# Patient Record
Sex: Female | Born: 1975 | Race: White | Hispanic: No | Marital: Single | Smoking: Current every day smoker
Health system: Southern US, Community
[De-identification: ages and names within clinical notes are randomized; demographics above are authoritative.]

## PROBLEM LIST (undated history)

## (undated) DIAGNOSIS — F418 Other specified anxiety disorders: Secondary | ICD-10-CM

## (undated) DIAGNOSIS — Z9889 Other specified postprocedural states: Secondary | ICD-10-CM

## (undated) DIAGNOSIS — G43909 Migraine, unspecified, not intractable, without status migrainosus: Secondary | ICD-10-CM

## (undated) DIAGNOSIS — R110 Nausea: Secondary | ICD-10-CM

## (undated) DIAGNOSIS — R12 Heartburn: Secondary | ICD-10-CM

## (undated) DIAGNOSIS — J189 Pneumonia, unspecified organism: Secondary | ICD-10-CM

## (undated) DIAGNOSIS — F329 Major depressive disorder, single episode, unspecified: Secondary | ICD-10-CM

## (undated) DIAGNOSIS — F32A Depression, unspecified: Secondary | ICD-10-CM

## (undated) DIAGNOSIS — L502 Urticaria due to cold and heat: Secondary | ICD-10-CM

## (undated) DIAGNOSIS — R519 Headache, unspecified: Secondary | ICD-10-CM

## (undated) DIAGNOSIS — J410 Simple chronic bronchitis: Secondary | ICD-10-CM

## (undated) DIAGNOSIS — F419 Anxiety disorder, unspecified: Secondary | ICD-10-CM

## (undated) DIAGNOSIS — K219 Gastro-esophageal reflux disease without esophagitis: Secondary | ICD-10-CM

## (undated) DIAGNOSIS — M1990 Unspecified osteoarthritis, unspecified site: Secondary | ICD-10-CM

## (undated) DIAGNOSIS — D071 Carcinoma in situ of vulva: Secondary | ICD-10-CM

## (undated) DIAGNOSIS — R87610 Atypical squamous cells of undetermined significance on cytologic smear of cervix (ASC-US): Secondary | ICD-10-CM

## (undated) DIAGNOSIS — M26629 Arthralgia of temporomandibular joint, unspecified side: Secondary | ICD-10-CM

## (undated) DIAGNOSIS — C801 Malignant (primary) neoplasm, unspecified: Secondary | ICD-10-CM

## (undated) DIAGNOSIS — G8929 Other chronic pain: Secondary | ICD-10-CM

## (undated) HISTORY — DX: Anxiety disorder, unspecified: F41.9

## (undated) HISTORY — DX: Unspecified osteoarthritis, unspecified site: M19.90

## (undated) HISTORY — PX: WISDOM TOOTH EXTRACTION: SHX21

## (undated) HISTORY — PX: EYE SURGERY: SHX253

## (undated) HISTORY — DX: Other specified anxiety disorders: F41.8

## (undated) HISTORY — DX: Atypical squamous cells of undetermined significance on cytologic smear of cervix (ASC-US): R87.610

## (undated) HISTORY — PX: LEEP: SHX91

## (undated) HISTORY — DX: Depression, unspecified: F32.A

## (undated) HISTORY — DX: Migraine, unspecified, not intractable, without status migrainosus: G43.909

## (undated) HISTORY — DX: Major depressive disorder, single episode, unspecified: F32.9

## (undated) HISTORY — DX: Nausea: R11.0

## (undated) HISTORY — DX: Heartburn: R12

---

## 1998-04-10 ENCOUNTER — Other Ambulatory Visit: Admission: RE | Admit: 1998-04-10 | Discharge: 1998-04-10 | Payer: Self-pay | Admitting: Obstetrics and Gynecology

## 1998-11-22 ENCOUNTER — Inpatient Hospital Stay (HOSPITAL_COMMUNITY): Admission: AD | Admit: 1998-11-22 | Discharge: 1998-11-24 | Payer: Self-pay

## 1999-08-02 ENCOUNTER — Emergency Department (HOSPITAL_COMMUNITY): Admission: EM | Admit: 1999-08-02 | Discharge: 1999-08-02 | Payer: Self-pay | Admitting: Emergency Medicine

## 1999-11-28 ENCOUNTER — Encounter: Admission: RE | Admit: 1999-11-28 | Discharge: 1999-11-28 | Payer: Self-pay | Admitting: Obstetrics & Gynecology

## 1999-11-28 ENCOUNTER — Other Ambulatory Visit: Admission: RE | Admit: 1999-11-28 | Discharge: 1999-11-28 | Payer: Self-pay | Admitting: *Deleted

## 2000-10-11 ENCOUNTER — Emergency Department (HOSPITAL_COMMUNITY): Admission: EM | Admit: 2000-10-11 | Discharge: 2000-10-11 | Payer: Self-pay | Admitting: *Deleted

## 2000-10-19 ENCOUNTER — Other Ambulatory Visit: Admission: RE | Admit: 2000-10-19 | Discharge: 2000-10-19 | Payer: Self-pay | Admitting: Obstetrics & Gynecology

## 2000-10-19 ENCOUNTER — Encounter: Admission: RE | Admit: 2000-10-19 | Discharge: 2000-10-19 | Payer: Self-pay | Admitting: Obstetrics & Gynecology

## 2000-10-21 ENCOUNTER — Encounter: Admission: RE | Admit: 2000-10-21 | Discharge: 2000-10-21 | Payer: Self-pay

## 2000-11-25 ENCOUNTER — Encounter: Admission: RE | Admit: 2000-11-25 | Discharge: 2000-11-25 | Payer: Self-pay | Admitting: Hematology and Oncology

## 2000-12-23 ENCOUNTER — Encounter: Admission: RE | Admit: 2000-12-23 | Discharge: 2000-12-23 | Payer: Self-pay

## 2000-12-29 ENCOUNTER — Other Ambulatory Visit: Admission: RE | Admit: 2000-12-29 | Discharge: 2000-12-29 | Payer: Self-pay | Admitting: Obstetrics & Gynecology

## 2000-12-29 ENCOUNTER — Emergency Department (HOSPITAL_COMMUNITY): Admission: EM | Admit: 2000-12-29 | Discharge: 2000-12-29 | Payer: Self-pay | Admitting: Emergency Medicine

## 2000-12-31 ENCOUNTER — Emergency Department (HOSPITAL_COMMUNITY): Admission: EM | Admit: 2000-12-31 | Discharge: 2000-12-31 | Payer: Self-pay | Admitting: Emergency Medicine

## 2001-01-04 ENCOUNTER — Encounter: Admission: RE | Admit: 2001-01-04 | Discharge: 2001-01-04 | Payer: Self-pay | Admitting: Obstetrics & Gynecology

## 2001-01-25 ENCOUNTER — Encounter: Admission: RE | Admit: 2001-01-25 | Discharge: 2001-01-25 | Payer: Self-pay | Admitting: Obstetrics & Gynecology

## 2001-11-22 ENCOUNTER — Emergency Department (HOSPITAL_COMMUNITY): Admission: EM | Admit: 2001-11-22 | Discharge: 2001-11-22 | Payer: Self-pay | Admitting: Emergency Medicine

## 2001-12-19 ENCOUNTER — Encounter: Admission: RE | Admit: 2001-12-19 | Discharge: 2001-12-19 | Payer: Self-pay | Admitting: Internal Medicine

## 2002-01-02 ENCOUNTER — Emergency Department (HOSPITAL_COMMUNITY): Admission: EM | Admit: 2002-01-02 | Discharge: 2002-01-02 | Payer: Self-pay | Admitting: Emergency Medicine

## 2002-03-09 ENCOUNTER — Emergency Department (HOSPITAL_COMMUNITY): Admission: EM | Admit: 2002-03-09 | Discharge: 2002-03-09 | Payer: Self-pay | Admitting: Emergency Medicine

## 2002-12-25 ENCOUNTER — Other Ambulatory Visit: Admission: RE | Admit: 2002-12-25 | Discharge: 2002-12-25 | Payer: Self-pay | Admitting: Obstetrics and Gynecology

## 2003-04-23 ENCOUNTER — Other Ambulatory Visit: Admission: RE | Admit: 2003-04-23 | Discharge: 2003-04-23 | Payer: Self-pay | Admitting: Obstetrics and Gynecology

## 2003-07-23 ENCOUNTER — Other Ambulatory Visit: Admission: RE | Admit: 2003-07-23 | Discharge: 2003-07-23 | Payer: Self-pay | Admitting: Obstetrics and Gynecology

## 2003-09-08 HISTORY — PX: TEMPOROMANDIBULAR JOINT SURGERY: SHX35

## 2003-10-29 ENCOUNTER — Other Ambulatory Visit: Admission: RE | Admit: 2003-10-29 | Discharge: 2003-10-29 | Payer: Self-pay | Admitting: Obstetrics and Gynecology

## 2004-07-11 ENCOUNTER — Emergency Department (HOSPITAL_COMMUNITY): Admission: EM | Admit: 2004-07-11 | Discharge: 2004-07-11 | Payer: Self-pay | Admitting: Emergency Medicine

## 2004-11-27 ENCOUNTER — Emergency Department (HOSPITAL_COMMUNITY): Admission: EM | Admit: 2004-11-27 | Discharge: 2004-11-27 | Payer: Self-pay | Admitting: Family Medicine

## 2004-12-26 ENCOUNTER — Emergency Department (HOSPITAL_COMMUNITY): Admission: EM | Admit: 2004-12-26 | Discharge: 2004-12-26 | Payer: Self-pay | Admitting: Family Medicine

## 2006-05-16 IMAGING — CR DG FINGER MIDDLE 2+V*L*
1 series · 1 of 1 positions shown · non-contrast
Comparison: none

CLINICAL DATA: Slammed finger in car door.
 LEFT THIRD FINGER ? 3 VIEWS:
 There is a fracture of the tuft of the distal phalanx without significant displacement.  There also appears to be a soft tissue defect in the nail bed.

[view not recorded]
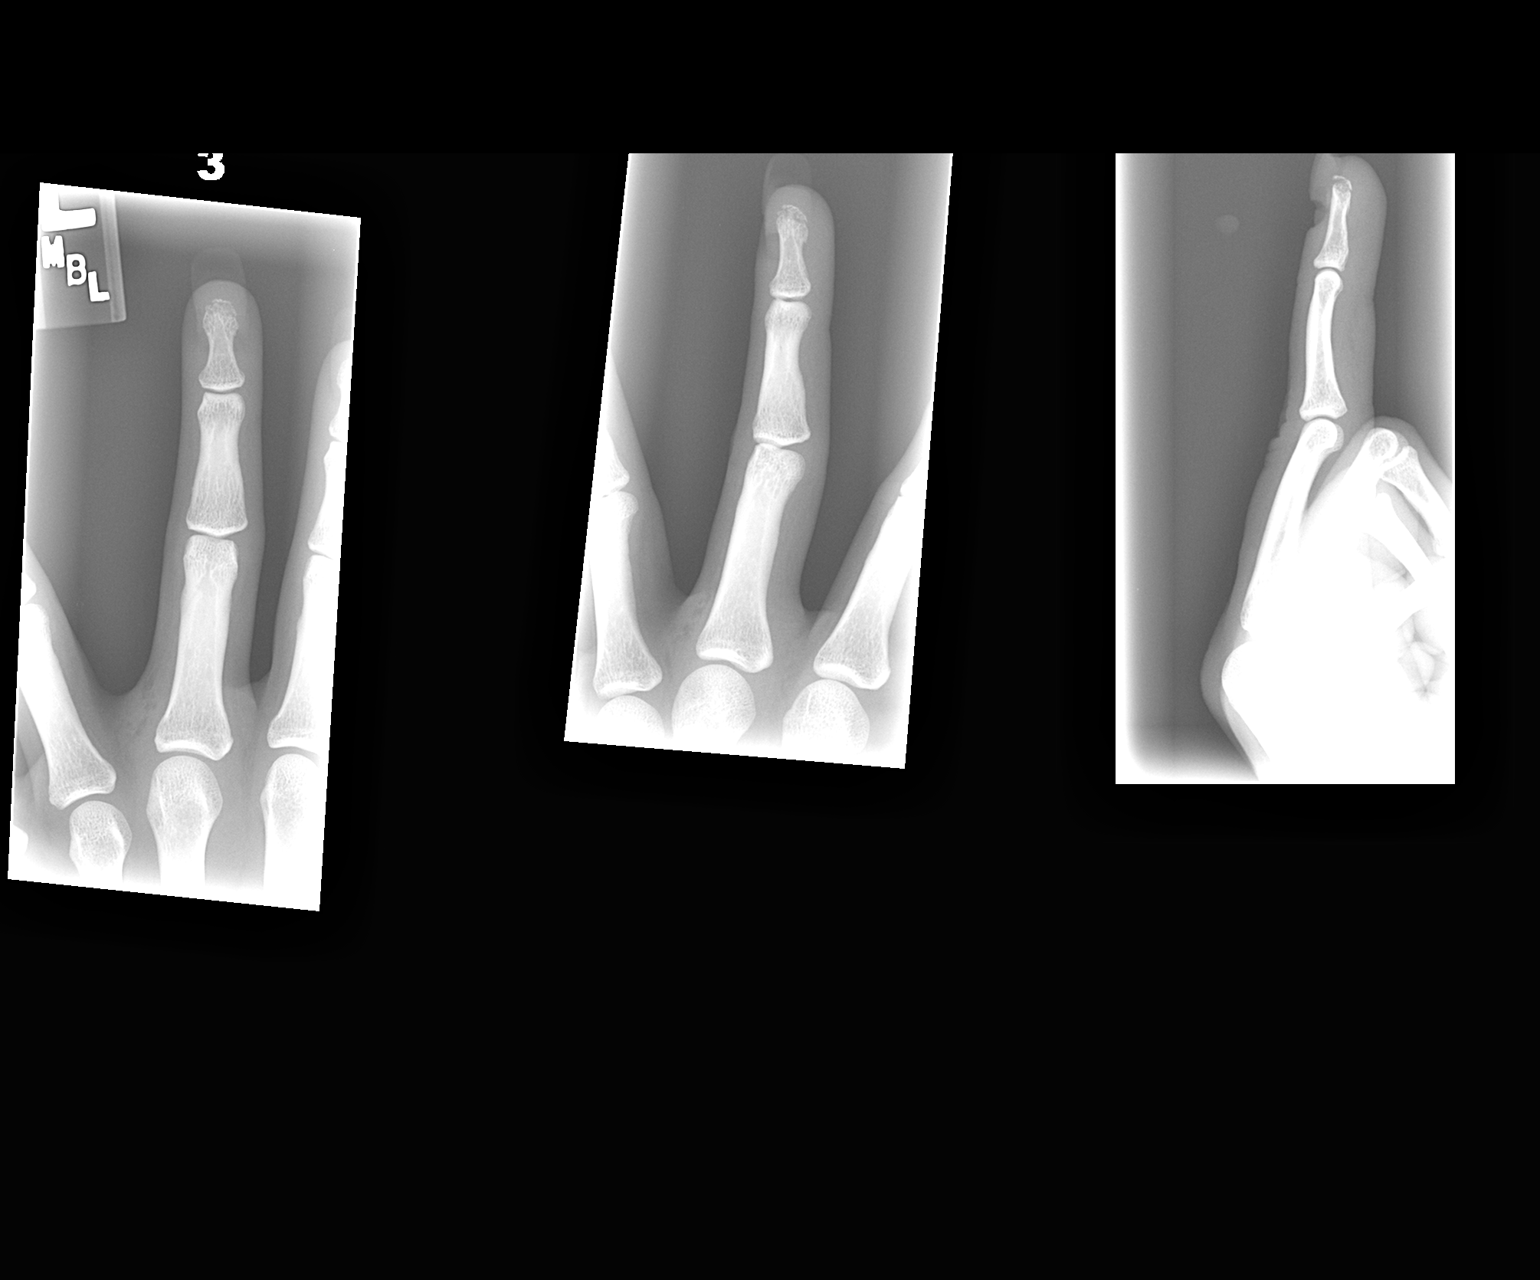

[1 of 1 positions shown; findings below may reference images not displayed]

IMPRESSION: Fracture of the tuft of the distal third phalanx.

## 2006-08-21 ENCOUNTER — Emergency Department (HOSPITAL_COMMUNITY): Admission: EM | Admit: 2006-08-21 | Discharge: 2006-08-21 | Payer: Self-pay | Admitting: Emergency Medicine

## 2008-07-20 ENCOUNTER — Emergency Department (HOSPITAL_COMMUNITY): Admission: EM | Admit: 2008-07-20 | Discharge: 2008-07-20 | Payer: Self-pay | Admitting: Emergency Medicine

## 2009-09-17 ENCOUNTER — Emergency Department (HOSPITAL_COMMUNITY): Admission: EM | Admit: 2009-09-17 | Discharge: 2009-09-17 | Payer: Self-pay | Admitting: Emergency Medicine

## 2010-01-02 ENCOUNTER — Emergency Department (HOSPITAL_COMMUNITY): Admission: EM | Admit: 2010-01-02 | Discharge: 2010-01-02 | Payer: Self-pay | Admitting: Family Medicine

## 2010-11-23 LAB — POCT RAPID STREP A (OFFICE): Streptococcus, Group A Screen (Direct): NEGATIVE

## 2010-11-25 LAB — WOUND CULTURE

## 2013-01-10 ENCOUNTER — Ambulatory Visit (INDEPENDENT_AMBULATORY_CARE_PROVIDER_SITE_OTHER): Payer: 59 | Admitting: Internal Medicine

## 2013-01-10 VITALS — BP 115/68 | HR 94 | Temp 98.1°F | Resp 16 | Ht 64.0 in | Wt 136.0 lb

## 2013-01-10 DIAGNOSIS — S161XXA Strain of muscle, fascia and tendon at neck level, initial encounter: Secondary | ICD-10-CM

## 2013-01-10 DIAGNOSIS — J039 Acute tonsillitis, unspecified: Secondary | ICD-10-CM

## 2013-01-10 DIAGNOSIS — J029 Acute pharyngitis, unspecified: Secondary | ICD-10-CM

## 2013-01-10 DIAGNOSIS — R509 Fever, unspecified: Secondary | ICD-10-CM

## 2013-01-10 DIAGNOSIS — S139XXA Sprain of joints and ligaments of unspecified parts of neck, initial encounter: Secondary | ICD-10-CM

## 2013-01-10 LAB — POCT RAPID STREP A (OFFICE): Rapid Strep A Screen: POSITIVE — AB

## 2013-01-10 MED ORDER — AMOXICILLIN 500 MG PO CAPS: 1000.0000 mg | ORAL_CAPSULE | Freq: Two times a day (BID) | ORAL | Status: DC

## 2013-01-10 MED ORDER — METHOCARBAMOL 750 MG PO TABS: 750.0000 mg | ORAL_TABLET | Freq: Four times a day (QID) | ORAL | Status: DC

## 2013-01-10 MED ORDER — HYDROCODONE-ACETAMINOPHEN 5-325 MG PO TABS: 1.0000 | ORAL_TABLET | Freq: Four times a day (QID) | ORAL | Status: DC | PRN

## 2013-01-10 NOTE — Progress Notes (Signed)
  Subjective:    Patient ID: Madison Leon, female    DOB: 07-Feb-1976, 37 y.o.   MRN: [id number]  HPI    Review of Systems     Objective:   Physical Exam        Assessment & Plan:  Neck strain/Wry neck Tonsillitis

## 2013-01-10 NOTE — Patient Instructions (Addendum)
Tonsillitis Tonsils are lumps of lymphoid tissues at the back of the throat. Each tonsil has 20 crevices (crypts). Tonsils help fight nose and throat infections and keep infection from spreading to other parts of the body for the first 18 months of life. Tonsillitis is an infection of the throat that causes the tonsils to become red, tender, and swollen. CAUSES Sudden and, if treated, temporary (acute) tonsillitis is usually caused by infection with streptococcal bacteria. Long lasting (chronic) tonsillitis occurs when the crypts of the tonsils become filled with pieces of food and bacteria, which makes it easy for the tonsils to become constantly infected. SYMPTOMS  Symptoms of tonsillitis include:  A sore throat.  White patches on the tonsils.  Fever.  Tiredness. DIAGNOSIS Tonsillitis can be diagnosed through a physical exam. Diagnosis can be confirmed with the results of lab tests, including a throat culture. TREATMENT  The goals of tonsillitis treatment include the reduction of the severity and duration of symptoms, prevention of associated conditions, and prevention of disease transmission. Tonsillitis caused by bacteria can be treated with antibiotics. Usually, treatment with antibiotics is started before the cause of the tonsillitis is known. However, if it is determined that the cause is not bacterial, antibiotics will not treat the tonsillitis. If attacks of tonsillitis are severe and frequent, your caregiver may recommend surgery to remove the tonsils (tonsillectomy). HOME CARE INSTRUCTIONS   Rest as much as possible and get plenty of sleep.  Drink plenty of fluids. While the throat is very sore, eat soft foods or liquids, such as sherbet, soups, or instant breakfast drinks.  Eat frozen ice pops.  Older children and adults may gargle with a warm or cold liquid to help soothe the throat. Mix 1 teaspoon of salt in 1 cup of water.  Other family members who also develop a sore  throat or fever should have a medical exam or throat culture.  Only take over-the-counter or prescription medicines for pain, discomfort, or fever as directed by your caregiver.  If you are given antibiotics, take them as directed. Finish them even if you start to feel better. SEEK MEDICAL CARE IF:   Your baby is older than 3 months with a rectal temperature of 100.5 F (38.1 C) or higher for more than 1 day.  Large, tender lumps develop in your neck.  A rash develops.  Green, yellow-brown, or bloody substance is coughed up.  You are unable to swallow liquids or food for 24 hours.  Your child is unable to swallow food or liquids for 12 hours. SEEK IMMEDIATE MEDICAL CARE IF:   You develop any new symptoms such as vomiting, severe headache, stiff neck, chest pain, or trouble breathing or swallowing.  You have severe throat pain along with drooling or voice changes.  You have severe pain, unrelieved with recommended medications.  You are unable to fully open the mouth.  You develop redness, swelling, or severe pain anywhere in the neck.  You have a fever.  Your baby is older than 3 months with a rectal temperature of 102 F (38.9 C) or higher.  Your baby is 35 months old or younger with a rectal temperature of 100.4 F (38 C) or higher. MAKE SURE YOU:   Understand these instructions.  Will watch your condition.  Will get help right away if you are not doing well or get worse. Document Released: 06/03/2005 Document Revised: 11/16/2011 Document Reviewed: 10/30/2010 [facility] Patient Information 2013 [place], [place]. Strep Throat Strep throat is an infection  of the throat caused by a bacteria named Streptococcus pyogenes. Your caregiver may call the infection streptococcal "tonsillitis" or "pharyngitis" depending on whether there are signs of inflammation in the tonsils or back of the throat. Strep throat is most common in children from 84 to 74 years old during the cold months  of the year, but it can occur in people of any age during any season. This infection is spread from person to person (contagious) through coughing, sneezing, or other close contact. SYMPTOMS   Fever or chills.  Painful, swollen, red tonsils or throat.  Pain or difficulty when swallowing.  White or yellow spots on the tonsils or throat.  Swollen, tender lymph nodes or "glands" of the neck or under the jaw.  Red rash all over the body (rare). DIAGNOSIS  Many different infections can cause the same symptoms. A test must be done to confirm the diagnosis so the right treatment can be given. A "rapid strep test" can help your caregiver make the diagnosis in a few minutes. If this test is not available, a light swab of the infected area can be used for a throat culture test. If a throat culture test is done, results are usually available in a day or two. TREATMENT  Strep throat is treated with antibiotic medicine. HOME CARE INSTRUCTIONS   Gargle with 1 tsp of salt in 1 cup of warm water, 3 to 4 times per day or as needed for comfort.  Family members who also have a sore throat or fever should be tested for strep throat and treated with antibiotics if they have the strep infection.  Make sure everyone in your household washes their hands well.  Do not share food, drinking cups, or personal items that could cause the infection to spread to others.  You may need to eat a soft food diet until your sore throat gets better.  Drink enough water and fluids to keep your urine clear or pale yellow. This will help prevent dehydration.  Get plenty of rest.  Stay home from school, daycare, or work until you have been on antibiotics for 24 hours.  Only take over-the-counter or prescription medicines for pain, discomfort, or fever as directed by your caregiver.  If antibiotics are prescribed, take them as directed. Finish them even if you start to feel better. SEEK MEDICAL CARE IF:   The glands in  your neck continue to enlarge.  You develop a rash, cough, or earache.  You cough up green, yellow-brown, or bloody sputum.  You have pain or discomfort not controlled by medicines.  Your problems seem to be getting worse rather than better. SEEK IMMEDIATE MEDICAL CARE IF:   You develop any new symptoms such as vomiting, severe headache, stiff or painful neck, chest pain, shortness of breath, or trouble swallowing.  You develop severe throat pain, drooling, or changes in your voice.  You develop swelling of the neck, or the skin on the neck becomes red and tender.  You have a fever.  You develop signs of dehydration, such as fatigue, dry mouth, and decreased urination.  You become increasingly sleepy, or you cannot wake up completely. Document Released: 08/21/2000 Document Revised: 11/16/2011 Document Reviewed: 10/23/2010 [facility] Patient Information 2013 [place], [place].

## 2013-01-10 NOTE — Progress Notes (Signed)
  Subjective:    Patient ID: Madison Leon, female    DOB: 11/05/1975, 37 y.o.   MRN: [id number]  HPI  37 y/o female c/o  sore throat x 1 day.  White spots on throat , headache , fever. Began yesterday, progressing this morning.  shooting pain left shoulder blade  X 1 day radiating to left neck when turning head.   .  No nausea, no vomiting, diarrhea. Has decreased sleep due to shoulder and neck pain.  Son had strep throat 2 weeks ago.No cough, sob.  No contact with anyone with illness.  Last Monday nausea and diarrhea, niece and nephew had GI bug, she was in contact with them both .   Review of Systems     Objective:   Physical Exam  Constitutional: She is oriented to person, place, and time. She appears well-developed and well-nourished.  HENT:  Right Ear: External ear normal.  Left Ear: External ear normal.  Nose: Nose normal.  Mouth/Throat: Oropharyngeal exudate present.  Eyes: EOM are normal. Pupils are equal, round, and reactive to light.  Neck: Normal range of motion.  Cardiovascular: Normal rate, regular rhythm and normal heart sounds.   Pulmonary/Chest: Effort normal and breath sounds normal.  Musculoskeletal: She exhibits tenderness.       Cervical back: She exhibits tenderness, pain and spasm. She exhibits normal range of motion and no bony tenderness.  Lymphadenopathy:    She has cervical adenopathy.  Neurological: She is alert and oriented to person, place, and time. She has normal strength. No cranial nerve deficit or sensory deficit. She exhibits normal muscle tone. Coordination normal.  Reflex Scores:      Tricep reflexes are 1+ on the right side and 1+ on the left side.      Bicep reflexes are 1+ on the right side and 1+ on the left side. Skin: No rash noted.  Psychiatric: She has a normal mood and affect.   No decreased strength  No tingling or loss of feeling  Good ROM  Results for orders placed in visit on 01/10/13  POCT RAPID STREP A (OFFICE)   Result Value Range   Rapid Strep A Screen Positive (*) Negative        Assessment & Plan:  Neck strain/Wry neck Tonsilitis/strep

## 2018-04-27 ENCOUNTER — Encounter: Payer: Self-pay | Admitting: *Deleted

## 2018-04-28 ENCOUNTER — Encounter: Payer: Self-pay | Admitting: Neurology

## 2018-04-28 ENCOUNTER — Encounter

## 2018-04-28 ENCOUNTER — Ambulatory Visit: Payer: 59 | Admitting: Neurology

## 2018-04-28 VITALS — BP 107/68 | HR 85 | Ht 64.0 in | Wt 143.0 lb

## 2018-04-28 DIAGNOSIS — G4440 Drug-induced headache, not elsewhere classified, not intractable: Secondary | ICD-10-CM | POA: Diagnosis not present

## 2018-04-28 DIAGNOSIS — R51 Headache with orthostatic component, not elsewhere classified: Secondary | ICD-10-CM

## 2018-04-28 DIAGNOSIS — H5462 Unqualified visual loss, left eye, normal vision right eye: Secondary | ICD-10-CM

## 2018-04-28 DIAGNOSIS — R519 Headache, unspecified: Secondary | ICD-10-CM

## 2018-04-28 MED ORDER — METHYLPREDNISOLONE 4 MG PO TBPK
ORAL_TABLET | ORAL | 1 refills | Status: DC
Start: 2018-04-28 — End: 2018-08-26

## 2018-04-28 MED ORDER — NORTRIPTYLINE HCL 25 MG PO CAPS: 25.0000 mg | ORAL_CAPSULE | Freq: Every day | ORAL | 3 refills | Status: DC

## 2018-04-28 MED ORDER — PROMETHAZINE HCL 25 MG PO TABS: 25.0000 mg | ORAL_TABLET | Freq: Four times a day (QID) | ORAL | 11 refills | Status: DC | PRN

## 2018-04-28 MED ORDER — TIZANIDINE HCL 4 MG PO TABS: 4.0000 mg | ORAL_TABLET | Freq: Three times a day (TID) | ORAL | 1 refills | Status: DC

## 2018-04-28 NOTE — Patient Instructions (Addendum)
As far as your medications are concerned, I would like to suggest: - Stop daily over the counter med use (Tylenol, excedrin, ibuprofen, alleve) Do not tak emore than 2-3x in one week - For the next 6 days take steroids (medrol). Watch for sedation, do not drive unless you know how the tizanidine affects you. DO NOT TAKE WITH FLEXERIL or other muscle relaxers may increase sedation.  - Tizanidine 3x a day for headache - Start Nortriptyline at night for headache/migraine prevention every night and insomia - phenergan for nausea or headache - MRI brain due to concerning symptoms of morning headaches, positional headaches,vision changes  to look for space occupying mass, chiari or intracranial hypertension (pseudotumor). - May consider sleep evaluation in the future.    Analgesic Rebound Headache An analgesic rebound headache, sometimes called a medication overuse headache, is a headache that comes after pain medicine (analgesic) taken to treat the original (primary) headache has worn off. Any type of primary headache can return as a rebound headache if a person regularly takes analgesics more than three times a week to treat it. The types of primary headaches that are commonly associated with rebound headaches include:  Migraines.  Headaches that arise from tense muscles in the head and neck area (tension headaches).  Headaches that develop and happen again (recur) on one side of the head and around the eye (cluster headaches).  If rebound headaches continue, they become chronic daily headaches. What are the causes? This condition may be caused by frequent use of:  Over-the-counter medicines such as aspirin, ibuprofen, and acetaminophen.  Sinus relief medicines and other medicines that contain caffeine.  Narcotic pain medicines such as codeine and oxycodone.  What are the signs or symptoms? The symptoms of a rebound headache are the same as the symptoms of the original headache. Some of the  symptoms of specific types of headaches include: Migraine headache  Pulsing or throbbing pain on one or both sides of the head.  Severe pain that interferes with daily activities.  Pain that is worsened by physical activity.  Nausea, vomiting, or both.  Pain with exposure to bright light, loud noises, or strong smells.  General sensitivity to bright light, loud noises, or strong smells.  Visual changes.  Numbness of one or both arms. Tension headache  Pressure around the head.  Dull, aching head pain.  Pain felt over the front and sides of the head.  Tenderness in the muscles of the head, neck, and shoulders. Cluster headache  Severe pain that begins in or around one eye or temple.  Redness and tearing in the eye on the same side as the pain.  Droopy or swollen eyelid.  One-sided head pain.  Nausea.  Runny nose.  Sweaty, pale facial skin.  Restlessness. How is this diagnosed? This condition is diagnosed by:  Reviewing your medical history. This includes the nature of your primary headaches.  Reviewing the types of pain medicines that you have been using to treat your headaches and how often you take them.  How is this treated? This condition may be treated or managed by:  Discontinuing frequent use of the analgesic medicine. Doing this may worsen your headaches at first, but the pain should eventually become more manageable, less frequent, and less severe.  Seeing a headache specialist. He or she may be able to help you manage your headaches and help make sure there is not another cause of the headaches.  Using methods of stress relief, such as acupuncture, counseling,  biofeedback, and massage. Talk with your health care provider about which methods might be good for you.  Follow these instructions at home:  Take over-the-counter and prescription medicines only as told by your health care provider.  Stop the repeated use of pain medicine as told by your  health care provider. Stopping can be difficult. Carefully follow instructions from your health care provider.  Avoid triggers that are known to cause your primary headaches.  Keep all follow-up visits as told by your health care provider. This is important. Contact a health care provider if:  You continue to experience headaches after following treatments that your health care provider recommended. Get help right away if:  You develop new headache pain.  You develop headache pain that is different than what you have experienced in the past.  You develop numbness or tingling in your arms or legs.  You develop changes in your speech or vision. This information is not intended to replace advice given to you by your health care provider. Make sure you discuss any questions you have with your health care provider. Document Released: 11/14/2003 Document Revised: 03/13/2016 Document Reviewed: 01/27/2016 Elsevier Interactive Patient Education  2018 [employer]  Nortriptyline capsules What is this medicine? NORTRIPTYLINE (nor TRIP ti leen) is used to treat depression. This medicine may be used for other purposes; ask your health care provider or pharmacist if you have questions. COMMON BRAND NAME(S): Aventyl, Pamelor What should I tell my health care provider before I take this medicine? They need to know if you have any of these conditions: -an alcohol problem -bipolar disorder or schizophrenia -difficulty passing urine, prostate trouble -glaucoma -heart disease or recent heart attack -liver disease -over active thyroid -seizures -thoughts or plans of suicide or a previous suicide attempt or family history of suicide attempt -an unusual or allergic reaction to nortriptyline, other medicines, foods, dyes, or preservatives -pregnant or trying to get pregnant -breast-feeding How should I use this medicine? Take this medicine by mouth with a glass of water. Follow the directions on  the prescription label. Take your doses at regular intervals. Do not take it more often than directed. Do not stop taking this medicine suddenly except upon the advice of your doctor. Stopping this medicine too quickly may cause serious side effects or your condition may worsen. A special MedGuide will be given to you by the pharmacist with each prescription and refill. Be sure to read this information carefully each time. Talk to your pediatrician regarding the use of this medicine in children. Special care may be needed. Overdosage: If you think you have taken too much of this medicine contact a poison control center or emergency room at once. NOTE: This medicine is only for you. Do not share this medicine with others. What if I miss a dose? If you miss a dose, take it as soon as you can. If it is almost time for your next dose, take only that dose. Do not take double or extra doses. What may interact with this medicine? Do not take this medicine with any of the following medications: -arsenic trioxide -certain medicines medicines for irregular heart beat -cisapride -halofantrine -linezolid -MAOIs like Carbex, Eldepryl, Marplan, Nardil, and Parnate -methylene blue (injected into a vein) -other medicines for mental depression -phenothiazines like perphenazine, thioridazine and chlorpromazine -pimozide -probucol -procarbazine -sparfloxacin -St. John's Wort -ziprasidone This medicine may also interact with any of the following medications: -atropine and related drugs like hyoscyamine, scopolamine, tolterodine and others -barbiturate medicines for  inducing sleep or treating seizures, such as phenobarbital -cimetidine -medicines for diabetes -medicines for seizures like carbamazepine or phenytoin -reserpine -thyroid medicine This list may not describe all possible interactions. Give your health care provider a list of all the medicines, herbs, non-prescription drugs, or dietary  supplements you use. Also tell them if you smoke, drink alcohol, or use illegal drugs. Some items may interact with your medicine. What should I watch for while using this medicine? Tell your doctor if your symptoms do not get better or if they get worse. Visit your doctor or health care professional for regular checks on your progress. Because it may take several weeks to see the full effects of this medicine, it is important to continue your treatment as prescribed by your doctor. Patients and their families should watch out for new or worsening thoughts of suicide or depression. Also watch out for sudden changes in feelings such as feeling anxious, agitated, panicky, irritable, hostile, aggressive, impulsive, severely restless, overly excited and hyperactive, or not being able to sleep. If this happens, especially at the beginning of treatment or after a change in dose, call your health care professional. Bonita Quin may get drowsy or dizzy. Do not drive, use machinery, or do anything that needs mental alertness until you know how this medicine affects you. Do not stand or sit up quickly, especially if you are an older patient. This reduces the risk of dizzy or fainting spells. Alcohol may interfere with the effect of this medicine. Avoid alcoholic drinks. Do not treat yourself for coughs, colds, or allergies without asking your doctor or health care professional for advice. Some ingredients can increase possible side effects. Your mouth may get dry. Chewing sugarless gum or sucking hard candy, and drinking plenty of water may help. Contact your doctor if the problem does not go away or is severe. This medicine may cause dry eyes and blurred vision. If you wear contact lenses you may feel some discomfort. Lubricating drops may help. See your eye doctor if the problem does not go away or is severe. This medicine can cause constipation. Try to have a bowel movement at least every 2 to 3 days. If you do not have a  bowel movement for 3 days, call your doctor or health care professional. This medicine can make you more sensitive to the sun. Keep out of the sun. If you cannot avoid being in the sun, wear protective clothing and use sunscreen. Do not use sun lamps or tanning beds/booths. What side effects may I notice from receiving this medicine? Side effects that you should report to your doctor or health care professional as soon as possible: -allergic reactions like skin rash, itching or hives, swelling of the face, lips, or tongue -anxious -breathing problems -changes in vision -confusion -elevated mood, decreased need for sleep, racing thoughts, impulsive behavior -eye pain -fast, irregular heartbeat -feeling faint or lightheaded, falls -feeling agitated, angry, or irritable -fever with increased sweating -hallucination, loss of contact with reality -seizures -stiff muscles -suicidal thoughts or other mood changes -tingling, pain, or numbness in the feet or hands -trouble passing urine or change in the amount of urine -trouble sleeping -unusually weak or tired -vomiting -yellowing of the eyes or skin Side effects that usually do not require medical attention (report to your doctor or health care professional if they continue or are bothersome): -change in sex drive or performance -change in appetite or weight -constipation -dizziness -dry mouth -nausea -tired -tremors -upset stomach This list may not  describe all possible side effects. Call your doctor for medical advice about side effects. You may report side effects to FDA at 1-800-FDA-1088. Where should I keep my medicine? Keep out of the reach of children. Store at room temperature between 15 and 30 degrees C (59 and 86 degrees F). Keep container tightly closed. Throw away any unused medicine after the expiration date. NOTE: This sheet is a summary. It may not cover all possible information. If you have questions about this  medicine, talk to your doctor, pharmacist, or health care provider.  2018 Elsevier/Gold Standard (2016-01-24 12:53:08)

## 2018-04-28 NOTE — Progress Notes (Signed)
[facility]    Provider:  Dr Lucia GaskinsAhern Referring Provider: Olivia Mackieaavon, Richard, MD Primary Care Physician:  Olivia Mackieaavon, Richard, MD  CC:  Headaches  HPI:  Madison Leon is a 42 y.o. female here as requested by Dr. Billy Coastaavon for migraines.  Past medical history migraine, depression, anxiety. She has daily headaches. She is taking BC powders and ibuprofen daily. She I slowing herself down. She was taking something daily. [facility]. She is trying to wean off. Now taking something every 3rd day. She is taking herself off of [facility]. Wakes daily with headaches. She has a lot of stress. She works 60 hours a week. She has vision changes in the left eye with the headache. Lots of stress. She is fatigued. The headaches are more tension type and in the temples and in the back of the head. She gets pulsating/pounding/throbbing, light and sound sensitivity, can be severe and make her cry, sleep helps, + nausea. Has to go lay down in a quiet dark room. Stress makes it worse. 15 days a month with migrainous qualities. No other focal neurologic deficits, associated symptoms, inciting events or modifiable factors. She doesn't sleep well, she never feel rested, morning headaches. No Fhx of migraines. Ongoing for years. +vision changes left eye and swollen, + positional worse when bending over. No other focal neurologic deficits, associated symptoms, inciting events or modifiable factors.  Review of Systems: Patient complains of symptoms per HPI as well as the following symptoms: Headache, sleepiness, snoring, restless legs, depression, anxiety, change in appetite, disinterest in activities. Pertinent negatives and positives per HPI. All others negative.   Social History   Socioeconomic History  . Marital status: Single    Spouse name: Not on file  . Number of children: 1  . Years of education: Not on file  . Highest education level: Associate degree: occupational, Scientist, product/process developmenttechnical, or  vocational program  Occupational History  . Not on file  Social Needs  . Financial resource strain: Not on file  . Food insecurity:    Worry: Not on file    Inability: Not on file  . Transportation needs:    Medical: Not on file    Non-medical: Not on file  Tobacco Use  . Smoking status: Current Every Day Smoker    Packs/day: 0.75    Types: Cigarettes  . Smokeless tobacco: Never Used  Substance and Sexual Activity  . Alcohol use: Yes    Comment: social  . Drug use: Never  . Sexual activity: Not on file  Lifestyle  . Physical activity:    Days per week: Not on file    Minutes per session: Not on file  . Stress: Not on file  Relationships  . Social connections:    Talks on phone: Not on file    Gets together: Not on file    Attends religious service: Not on file    Active member of club or organization: Not on file    Attends meetings of clubs or organizations: Not on file    Relationship status: Not on file  . Intimate partner violence:    Fear of current or ex partner: Not on file    Emotionally abused: Not on file    Physically abused: Not on file    Forced sexual activity: Not on file  Other Topics Concern  . Not on file  Social History Narrative   Lives at home with her mother and nephew   Right handed  Caffeine: daily, about 3-4 cups     Family History  Problem Relation Age of Onset  . Hepatitis C Mother   . Hypertension Mother   . Fibromyalgia Mother   . Congestive Heart Failure Mother   . Lung cancer Maternal Grandfather     Past Medical History:  Diagnosis Date  . Anxiety   . Arthritis    lower back  . ASCUS of cervix with negative high risk HPV   . Depression   . Heartburn   . Migraine   . Nausea without vomiting   . Situational anxiety     Past Surgical History:  Procedure Laterality Date  . EYE SURGERY     laser eye surgery  . LEEP    . TEMPOROMANDIBULAR JOINT SURGERY Bilateral 2005   injections, was sedated    Current  Outpatient Medications  Medication Sig Dispense Refill  . ALPRAZolam (XANAX) 0.5 MG tablet Take 0.5 mg by mouth daily as needed for anxiety.    . famotidine (PEPCID) 20 MG tablet Take 20 mg by mouth 2 (two) times daily as needed for heartburn or indigestion.    Marland Kitchen HYDROcodone-acetaminophen (NORCO/VICODIN) 5-325 MG per tablet Take 1 tablet by mouth every 6 (six) hours as needed for pain. 30 tablet 0  . ibuprofen (ADVIL,MOTRIN) 800 MG tablet Take 800 mg by mouth every 8 (eight) hours as needed.    Marland Kitchen levonorgestrel (MIRENA, 52 MG,) 20 MCG/24HR IUD 1 each by Intrauterine route once.    . methylPREDNISolone (MEDROL DOSEPAK) 4 MG TBPK tablet follow package directions 21 tablet 1  . nortriptyline (PAMELOR) 25 MG capsule Take 1 capsule (25 mg total) by mouth at bedtime. 30 capsule 3  . promethazine (PHENERGAN) 25 MG tablet Take 1 tablet (25 mg total) by mouth every 6 (six) hours as needed for nausea or vomiting. 30 tablet 11  . tiZANidine (ZANAFLEX) 4 MG tablet Take 1 tablet (4 mg total) by mouth 3 (three) times daily. 90 tablet 1   No current facility-administered medications for this visit.     Allergies as of 04/28/2018  . (No Known Allergies)    Vitals: BP 107/68 (BP Location: Right Arm, Patient Position: Sitting)   Pulse 85   Ht 5\' 4"  (1.626 m)   Wt 143 lb (64.9 kg)   BMI 24.55 kg/m  Last Weight:  Wt Readings from Last 1 Encounters:  04/28/18 143 lb (64.9 kg)   Last Height:   Ht Readings from Last 1 Encounters:  04/28/18 5\' 4"  (1.626 m)    Physical exam: Exam: Gen: NAD, conversant, well nourised, well groomed                     CV: RRR, no MRG. No Carotid Bruits. No peripheral edema, warm, nontender Eyes: Conjunctivae clear without exudates or hemorrhage  Neuro: Detailed Neurologic Exam  Speech:    Speech is normal; fluent and spontaneous with normal comprehension.  Cognition:    The patient is oriented to person, place, and time;     recent and remote memory intact;      language fluent;     normal attention, concentration,     fund of knowledge Cranial Nerves:    The pupils are equal, round, and reactive to light. The fundi are normal and spontaneous venous pulsations are present. Visual fields are full to finger confrontation. Extraocular movements are intact. Trigeminal sensation is intact and the muscles of mastication are normal. The face is symmetric. The palate  elevates in the midline. Hearing intact. Voice is normal. Shoulder shrug is normal. The tongue has normal motion without fasciculations.   Coordination:    Normal finger to nose and heel to shin. Normal rapid alternating movements.   Gait:    Heel-toe and tandem gait are normal.   Motor Observation:    No asymmetry, no atrophy, and no involuntary movements noted. Tone:    Normal muscle tone.    Posture:    Posture is normal. normal erect    Strength:    Strength is V/V in the upper and lower limbs.      Sensation: intact to LT     Reflex Exam:  DTR's:    Deep tendon reflexes in the upper and lower extremities are normal bilaterally.   Toes:    The toes are downgoing bilaterally.   Clonus:    Clonus is absent.      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 They only thing to do is to stop the medication unfortunately. In the timeframe after stopping at her headaches may get much worse. I will give her some medication to try to bridge that. She should significantly  improve with her chronic daily headaches after 2-4 weeks of being off her daily over-the-counter medications. Do not use these medications more than 2 times in a week.    As far as your medications are concerned, I would like to suggest: - Stop daily over the counter med use (Tylenol,  excedrin, ibuprofen, alleve) Do not tak emore than 2-3x in one week - For the next 6 days take steroids (medrol) and tizanidine. Watch for sedation, do not drive unless you know how the tizanidine affects you. DO NOT TAKE WITH FLEXERIL or other muscle relaxers may increase sedation.  - Start Nortriptyline at night for headache/migraine prevention  - phenergan for nausea or headache - MRI brain due to concerning symptoms of morning headaches, positional headaches,vision changes  to look for space occupying mass, chiari or intracranial hypertension (pseudotumor). - May consider sleep evaluation in the future.   Meds ordered this encounter  Medications  . methylPREDNISolone (MEDROL DOSEPAK) 4 MG TBPK tablet    Sig: follow package directions    Dispense:  21 tablet    Refill:  1  . tiZANidine (ZANAFLEX) 4 MG tablet    Sig: Take 1 tablet (4 mg total) by mouth 3 (three) times daily.    Dispense:  90 tablet    Refill:  1  . nortriptyline (PAMELOR) 25 MG capsule    Sig: Take 1 capsule (25 mg total) by mouth at bedtime.    Dispense:  30 capsule    Refill:  3  . promethazine (PHENERGAN) 25 MG tablet    Sig: Take 1 tablet (25 mg total) by mouth every 6 (six) hours as needed for nausea or vomiting.    Dispense:  30 tablet    Refill:  11   Orders Placed This Encounter  Procedures  . MR BRAIN W WO CONTRAST  . CBC  . Comprehensive metabolic panel  . Thyroid Panel With TSH    Discussed: To prevent or relieve headaches, try the following: Cool Compress. Lie down and place a cool compress on your head.  Avoid headache triggers. If certain foods or  odors seem to have triggered your migraines in the past, avoid them. A headache diary might help you identify triggers.  Include physical activity in your daily routine. Try a daily walk or other moderate aerobic exercise.  Manage stress. Find healthy ways to cope with the stressors, such as delegating tasks on your to-do list.  Practice relaxation  techniques. Try deep breathing, yoga, massage and visualization.  Eat regularly. Eating regularly scheduled meals and maintaining a healthy diet might help prevent headaches. Also, drink plenty of fluids.  Follow a regular sleep schedule. Sleep deprivation might contribute to headaches Consider biofeedback. With this mind-body technique, you learn to control certain bodily functions - such as muscle tension, heart rate and blood pressure - to prevent headaches or reduce headache pain.    Proceed to emergency room if you experience new or worsening symptoms or symptoms do not resolve, if you have new neurologic symptoms or if headache is severe, or for any concerning symptom.   Provided education and documentation from American headache Society toolbox including articles on: chronic migraine medication overuse headache, chronic migraines, prevention of migraines, behavioral and other nonpharmacologic treatments for headache.  Naomie Dean, MD  [facility] [address] [place], [place] [address]  Phone [phone] Fax [phone]  A total of 60 minutes was spent face-to-face with this patient. Over half this time was spent on counseling patient on the  1. Chronic intractable headache, unspecified headache type   2. Vision loss, left eye   3. Positional headache   4. Morning headache   5. Medication overuse headache     diagnosis and different diagnostic and therapeutic options, counseling and coordination of care, risks ans benefits of management, compliance, or risk factor reduction and education.    Cc: Olivia Mackie, MD

## 2018-04-29 LAB — THYROID PANEL WITH TSH
Free Thyroxine Index: 2.1 (ref 1.2–4.9)
T3 Uptake Ratio: 29 % (ref 24–39)
T4, Total: 7.4 ug/dL (ref 4.5–12.0)
TSH: 0.832 u[IU]/mL (ref 0.450–4.500)

## 2018-04-29 LAB — COMPREHENSIVE METABOLIC PANEL
A/G RATIO: 2.3 — AB (ref 1.2–2.2)
ALBUMIN: 4.2 g/dL (ref 3.5–5.5)
ALT: 11 IU/L (ref 0–32)
AST: 15 IU/L (ref 0–40)
Alkaline Phosphatase: 57 IU/L (ref 39–117)
BILIRUBIN TOTAL: 0.5 mg/dL (ref 0.0–1.2)
BUN / CREAT RATIO: 11 (ref 9–23)
BUN: 8 mg/dL (ref 6–24)
CHLORIDE: 106 mmol/L (ref 96–106)
CO2: 21 mmol/L (ref 20–29)
Calcium: 9 mg/dL (ref 8.7–10.2)
Creatinine, Ser: 0.74 mg/dL (ref 0.57–1.00)
GFR calc non Af Amer: 102 mL/min/{1.73_m2} (ref >59)
GFR, EST AFRICAN AMERICAN: 117 mL/min/{1.73_m2} (ref >59)
GLOBULIN, TOTAL: 1.8 g/dL (ref 1.5–4.5)
Glucose: 80 mg/dL (ref 65–99)
POTASSIUM: 4.8 mmol/L (ref 3.5–5.2)
SODIUM: 141 mmol/L (ref 134–144)
TOTAL PROTEIN: 6 g/dL (ref 6.0–8.5)

## 2018-04-29 LAB — CBC
HEMATOCRIT: 41.7 % (ref 34.0–46.6)
HEMOGLOBIN: 13.9 g/dL (ref 11.1–15.9)
MCH: 31.7 pg (ref 26.6–33.0)
MCHC: 33.3 g/dL (ref 31.5–35.7)
MCV: 95 fL (ref 79–97)
PLATELETS: 371 10*3/uL (ref 150–450)
RBC: 4.38 x10E6/uL (ref 3.77–5.28)
RDW: 12.8 % (ref 12.3–15.4)
WBC: 11.9 10*3/uL — ABNORMAL HIGH (ref 3.4–10.8)

## 2018-05-02 ENCOUNTER — Telehealth: Payer: Self-pay | Admitting: Neurology

## 2018-05-02 NOTE — Telephone Encounter (Signed)
Just an FYI..  I spoke to the patient and informed her since her deductible hasn't been met the estimate cost would be about $1,639.37 I did offer a payment plan she stated she will think about it and get back to me..  She did ask me if the results for her thyroid panel has come in yet?

## 2018-05-02 NOTE — Telephone Encounter (Signed)
Thyroid panel was normal thanks

## 2018-05-02 NOTE — Telephone Encounter (Signed)
Spoke with patient and informed her that her thyroid panel was normal. She stated that she had just seen the results on her mychart. She did want to inform us that she had not started the steroid until the day after. She was advised that the can retest with our office or with PCP in a few months. She may talk to her PCP to address questions. She verbalized appreciation and will f/u on the increased WBCs.

## 2018-05-02 NOTE — Telephone Encounter (Signed)
lvm for pt to call me back about scheduling MRI  [facility] [id number] (exp. 04/28/18 to 06/12/18)

## 2018-07-06 ENCOUNTER — Ambulatory Visit: Payer: Self-pay | Admitting: Otolaryngology

## 2018-07-06 ENCOUNTER — Encounter (HOSPITAL_BASED_OUTPATIENT_CLINIC_OR_DEPARTMENT_OTHER)
Admission: RE | Disposition: A | Discharge status: Home / Self Care | Payer: Self-pay | Source: Ambulatory Visit | Attending: Otolaryngology

## 2018-07-06 ENCOUNTER — Encounter (HOSPITAL_BASED_OUTPATIENT_CLINIC_OR_DEPARTMENT_OTHER): Payer: Self-pay

## 2018-07-06 ENCOUNTER — Ambulatory Visit (HOSPITAL_BASED_OUTPATIENT_CLINIC_OR_DEPARTMENT_OTHER)
Admission: RE | Admit: 2018-07-06 | Discharge: 2018-07-06 | Disposition: A | Discharge status: Home / Self Care | Payer: 59 | Source: Ambulatory Visit | Attending: Otolaryngology | Admitting: Otolaryngology

## 2018-07-06 ENCOUNTER — Ambulatory Visit (HOSPITAL_BASED_OUTPATIENT_CLINIC_OR_DEPARTMENT_OTHER): Payer: 59 | Admitting: Certified Registered"

## 2018-07-06 ENCOUNTER — Other Ambulatory Visit: Payer: Self-pay

## 2018-07-06 DIAGNOSIS — F329 Major depressive disorder, single episode, unspecified: Secondary | ICD-10-CM | POA: Insufficient documentation

## 2018-07-06 DIAGNOSIS — Z7982 Long term (current) use of aspirin: Secondary | ICD-10-CM | POA: Insufficient documentation

## 2018-07-06 DIAGNOSIS — Z79899 Other long term (current) drug therapy: Secondary | ICD-10-CM | POA: Insufficient documentation

## 2018-07-06 DIAGNOSIS — M1990 Unspecified osteoarthritis, unspecified site: Secondary | ICD-10-CM | POA: Diagnosis not present

## 2018-07-06 DIAGNOSIS — R040 Epistaxis: Secondary | ICD-10-CM | POA: Diagnosis not present

## 2018-07-06 DIAGNOSIS — M4696 Unspecified inflammatory spondylopathy, lumbar region: Secondary | ICD-10-CM | POA: Diagnosis not present

## 2018-07-06 DIAGNOSIS — G43909 Migraine, unspecified, not intractable, without status migrainosus: Secondary | ICD-10-CM | POA: Diagnosis not present

## 2018-07-06 DIAGNOSIS — F419 Anxiety disorder, unspecified: Secondary | ICD-10-CM | POA: Insufficient documentation

## 2018-07-06 DIAGNOSIS — F17210 Nicotine dependence, cigarettes, uncomplicated: Secondary | ICD-10-CM | POA: Diagnosis not present

## 2018-07-06 HISTORY — PX: NASAL ENDOSCOPY WITH EPISTAXIS CONTROL: SHX5664

## 2018-07-06 HISTORY — DX: Urticaria due to cold and heat: L50.2

## 2018-07-06 LAB — CBC WITH DIFFERENTIAL/PLATELET
Abs Immature Granulocytes: 0.1 10*3/uL — ABNORMAL HIGH (ref 0.00–0.07)
BASOS ABS: 0.2 10*3/uL — AB (ref 0.0–0.1)
Basophils Relative: 1 %
EOS ABS: 0.2 10*3/uL (ref 0.0–0.5)
EOS PCT: 2 %
HCT: 42.5 % (ref 36.0–46.0)
HEMOGLOBIN: 14.1 g/dL (ref 12.0–15.0)
Immature Granulocytes: 1 %
LYMPHS PCT: 27 %
Lymphs Abs: 3.2 10*3/uL (ref 0.7–4.0)
MCH: 30.4 pg (ref 26.0–34.0)
MCHC: 33.2 g/dL (ref 30.0–36.0)
MCV: 91.6 fL (ref 80.0–100.0)
MONO ABS: 0.8 10*3/uL (ref 0.1–1.0)
Monocytes Relative: 7 %
NEUTROS PCT: 62 %
NRBC: 0 % (ref 0.0–0.2)
Neutro Abs: 7.6 10*3/uL (ref 1.7–7.7)
Platelets: 386 10*3/uL (ref 150–400)
RBC: 4.64 MIL/uL (ref 3.87–5.11)
RDW: 12.9 % (ref 11.5–15.5)
WBC: 12.2 10*3/uL — AB (ref 4.0–10.5)

## 2018-07-06 SURGERY — CONTROL OF EPISTAXIS, ENDOSCOPIC
Anesthesia: General | Site: Nose | Laterality: Bilateral

## 2018-07-06 MED ORDER — MUPIROCIN CALCIUM 2 % EX CREA
TOPICAL_CREAM | CUTANEOUS | Status: AC
  Filled 2018-07-06: qty 15

## 2018-07-06 MED ORDER — ONDANSETRON HCL 4 MG/2ML IJ SOLN
INTRAMUSCULAR | Status: AC
  Filled 2018-07-06: qty 2

## 2018-07-06 MED ORDER — ACETAMINOPHEN 325 MG PO TABS: 325.0000 mg | ORAL_TABLET | ORAL | Status: DC | PRN

## 2018-07-06 MED ORDER — SUCCINYLCHOLINE CHLORIDE 20 MG/ML IJ SOLN
INTRAMUSCULAR | Status: DC | PRN
  Administered 2018-07-06: 140 mg via INTRAVENOUS

## 2018-07-06 MED ORDER — PHENYLEPHRINE 40 MCG/ML (10ML) SYRINGE FOR IV PUSH (FOR BLOOD PRESSURE SUPPORT)
PREFILLED_SYRINGE | INTRAVENOUS | Status: AC
  Filled 2018-07-06: qty 10

## 2018-07-06 MED ORDER — CEPHALEXIN 500 MG PO CAPS: 500.0000 mg | ORAL_CAPSULE | Freq: Two times a day (BID) | ORAL | 0 refills | Status: DC

## 2018-07-06 MED ORDER — SILVER NITRATE-POT NITRATE 75-25 % EX MISC
CUTANEOUS | Status: AC
  Filled 2018-07-06: qty 1

## 2018-07-06 MED ORDER — FENTANYL CITRATE (PF) 100 MCG/2ML IJ SOLN
INTRAMUSCULAR | Status: AC
  Filled 2018-07-06: qty 2

## 2018-07-06 MED ORDER — PROPOFOL 10 MG/ML IV BOLUS
INTRAVENOUS | Status: AC
  Filled 2018-07-06: qty 20

## 2018-07-06 MED ORDER — MIDAZOLAM HCL 2 MG/2ML IJ SOLN
INTRAMUSCULAR | Status: AC
  Filled 2018-07-06: qty 2

## 2018-07-06 MED ORDER — CHLORHEXIDINE GLUCONATE CLOTH 2 % EX PADS: 6.0000 | MEDICATED_PAD | Freq: Once | CUTANEOUS | Status: DC

## 2018-07-06 MED ORDER — SCOPOLAMINE 1 MG/3DAYS TD PT72: 1.0000 | MEDICATED_PATCH | Freq: Once | TRANSDERMAL | Status: DC | PRN

## 2018-07-06 MED ORDER — CEFAZOLIN SODIUM-DEXTROSE 2-4 GM/100ML-% IV SOLN
INTRAVENOUS | Status: AC
  Filled 2018-07-06: qty 100

## 2018-07-06 MED ORDER — MUPIROCIN 2 % EX OINT
TOPICAL_OINTMENT | CUTANEOUS | Status: DC | PRN
  Administered 2018-07-06: 1 via TOPICAL

## 2018-07-06 MED ORDER — EPHEDRINE SULFATE-NACL 50-0.9 MG/10ML-% IV SOSY
PREFILLED_SYRINGE | INTRAVENOUS | Status: DC | PRN
  Administered 2018-07-06: 5 mg via INTRAVENOUS
  Administered 2018-07-06 (×3): 10 mg via INTRAVENOUS

## 2018-07-06 MED ORDER — FENTANYL CITRATE (PF) 100 MCG/2ML IJ SOLN: 50.0000 ug | INTRAMUSCULAR | Status: DC | PRN

## 2018-07-06 MED ORDER — DEXAMETHASONE SODIUM PHOSPHATE 10 MG/ML IJ SOLN
INTRAMUSCULAR | Status: DC | PRN
  Administered 2018-07-06: 10 mg via INTRAVENOUS

## 2018-07-06 MED ORDER — LIDOCAINE-EPINEPHRINE 1 %-1:100000 IJ SOLN
INTRAMUSCULAR | Status: AC
  Filled 2018-07-06: qty 1

## 2018-07-06 MED ORDER — MEPERIDINE HCL 25 MG/ML IJ SOLN: 6.2500 mg | INTRAMUSCULAR | Status: DC | PRN

## 2018-07-06 MED ORDER — DEXAMETHASONE SODIUM PHOSPHATE 10 MG/ML IJ SOLN
INTRAMUSCULAR | Status: AC
  Filled 2018-07-06: qty 1

## 2018-07-06 MED ORDER — DEXMEDETOMIDINE HCL IN NACL 200 MCG/50ML IV SOLN
INTRAVENOUS | Status: DC | PRN
  Administered 2018-07-06: 10 ug via INTRAVENOUS

## 2018-07-06 MED ORDER — SUCCINYLCHOLINE CHLORIDE 200 MG/10ML IV SOSY
PREFILLED_SYRINGE | INTRAVENOUS | Status: AC
  Filled 2018-07-06: qty 10

## 2018-07-06 MED ORDER — MUPIROCIN 2 % EX OINT
TOPICAL_OINTMENT | CUTANEOUS | Status: AC
  Filled 2018-07-06: qty 22

## 2018-07-06 MED ORDER — ONDANSETRON HCL 4 MG/2ML IJ SOLN
INTRAMUSCULAR | Status: DC | PRN
  Administered 2018-07-06: 4 mg via INTRAVENOUS

## 2018-07-06 MED ORDER — LIDOCAINE 2% (20 MG/ML) 5 ML SYRINGE
INTRAMUSCULAR | Status: AC
  Filled 2018-07-06: qty 5

## 2018-07-06 MED ORDER — PROPOFOL 10 MG/ML IV BOLUS
INTRAVENOUS | Status: DC | PRN
  Administered 2018-07-06: 50 mg via INTRAVENOUS
  Administered 2018-07-06: 120 mg via INTRAVENOUS
  Administered 2018-07-06: 30 mg via INTRAVENOUS

## 2018-07-06 MED ORDER — OXYMETAZOLINE HCL 0.05 % NA SOLN
NASAL | Status: DC | PRN
  Administered 2018-07-06: 1 via TOPICAL

## 2018-07-06 MED ORDER — PHENYLEPHRINE 40 MCG/ML (10ML) SYRINGE FOR IV PUSH (FOR BLOOD PRESSURE SUPPORT)
PREFILLED_SYRINGE | INTRAVENOUS | Status: DC | PRN
  Administered 2018-07-06: 80 ug via INTRAVENOUS
  Administered 2018-07-06 (×2): 100 ug via INTRAVENOUS

## 2018-07-06 MED ORDER — OXYCODONE HCL 5 MG/5ML PO SOLN: 5.0000 mg | Freq: Once | ORAL | Status: DC | PRN

## 2018-07-06 MED ORDER — MIDAZOLAM HCL 2 MG/2ML IJ SOLN
INTRAMUSCULAR | Status: DC | PRN
  Administered 2018-07-06: 2 mg via INTRAVENOUS

## 2018-07-06 MED ORDER — ONDANSETRON HCL 4 MG/2ML IJ SOLN: 4.0000 mg | Freq: Once | INTRAMUSCULAR | Status: DC | PRN

## 2018-07-06 MED ORDER — MIDAZOLAM HCL 2 MG/2ML IJ SOLN: 1.0000 mg | INTRAMUSCULAR | Status: DC | PRN

## 2018-07-06 MED ORDER — ACETAMINOPHEN 160 MG/5ML PO SOLN: 325.0000 mg | ORAL | Status: DC | PRN

## 2018-07-06 MED ORDER — OXYCODONE HCL 5 MG PO TABS: 5.0000 mg | ORAL_TABLET | Freq: Once | ORAL | Status: DC | PRN

## 2018-07-06 MED ORDER — LACTATED RINGERS IV SOLN
INTRAVENOUS | Status: DC
  Administered 2018-07-06 (×2): via INTRAVENOUS

## 2018-07-06 MED ORDER — CEFAZOLIN SODIUM-DEXTROSE 2-4 GM/100ML-% IV SOLN
2.0000 g | INTRAVENOUS | Status: AC
  Administered 2018-07-06: 2 g via INTRAVENOUS

## 2018-07-06 MED ORDER — FENTANYL CITRATE (PF) 100 MCG/2ML IJ SOLN
25.0000 ug | INTRAMUSCULAR | Status: DC | PRN
  Administered 2018-07-06 (×2): 25 ug via INTRAVENOUS

## 2018-07-06 MED ORDER — OXYMETAZOLINE HCL 0.05 % NA SOLN
NASAL | Status: AC
  Filled 2018-07-06: qty 15

## 2018-07-06 MED ORDER — FENTANYL CITRATE (PF) 100 MCG/2ML IJ SOLN
INTRAMUSCULAR | Status: DC | PRN
  Administered 2018-07-06: 100 ug via INTRAVENOUS

## 2018-07-06 MED ORDER — LIDOCAINE HCL (CARDIAC) PF 100 MG/5ML IV SOSY
PREFILLED_SYRINGE | INTRAVENOUS | Status: DC | PRN
  Administered 2018-07-06: 100 mg via INTRAVENOUS

## 2018-07-06 SURGICAL SUPPLY — 43 items
APL SWBSTK 6 STRL LF DISP (MISCELLANEOUS) ×1
APPLICATOR COTTON TIP 6 STRL (MISCELLANEOUS) ×1 IMPLANT
APPLICATOR COTTON TIP 6IN STRL (MISCELLANEOUS) ×3
CANISTER SUCT 1200ML W/VALVE (MISCELLANEOUS) ×3 IMPLANT
COAGULATOR SUCT 8FR VV (MISCELLANEOUS) ×2 IMPLANT
CONT SPEC 4OZ CLIKSEAL STRL BL (MISCELLANEOUS) IMPLANT
CORD BIPOLAR FORCEPS 12FT (ELECTRODE) IMPLANT
COVER MAYO STAND STRL (DRAPES) ×2 IMPLANT
COVER WAND RF STERILE (DRAPES) IMPLANT
DECANTER SPIKE VIAL GLASS SM (MISCELLANEOUS) IMPLANT
DEPRESSOR TONGUE BLADE STERILE (MISCELLANEOUS) IMPLANT
DRESSING NASAL POPE 10X1.5X2.5 (GAUZE/BANDAGES/DRESSINGS) IMPLANT
DRSG NASAL POPE 10X1.5X2.5 (GAUZE/BANDAGES/DRESSINGS)
DRSG NASOPORE 8CM (GAUZE/BANDAGES/DRESSINGS) ×2 IMPLANT
DRSG TELFA 3X8 NADH (GAUZE/BANDAGES/DRESSINGS) IMPLANT
ELECT REM PT RETURN 9FT ADLT (ELECTROSURGICAL) ×3
ELECT REM PT RETURN 9FT PED (ELECTROSURGICAL)
ELECTRODE REM PT RETRN 9FT PED (ELECTROSURGICAL) IMPLANT
ELECTRODE REM PT RTRN 9FT ADLT (ELECTROSURGICAL) IMPLANT
GAUZE SPONGE 4X4 12PLY STRL LF (GAUZE/BANDAGES/DRESSINGS) ×3 IMPLANT
GLOVE BIO SURGEON STRL SZ7 (GLOVE) ×2 IMPLANT
GLOVE SS BIOGEL STRL SZ 7.5 (GLOVE) ×1 IMPLANT
GLOVE SUPERSENSE BIOGEL SZ 7.5 (GLOVE) ×2
GOWN STRL REUS W/ TWL LRG LVL3 (GOWN DISPOSABLE) IMPLANT
GOWN STRL REUS W/ TWL XL LVL3 (GOWN DISPOSABLE) IMPLANT
GOWN STRL REUS W/TWL LRG LVL3 (GOWN DISPOSABLE) ×3
GOWN STRL REUS W/TWL XL LVL3 (GOWN DISPOSABLE) ×3
HEMOSTAT SURGICEL 2X14 (HEMOSTASIS) IMPLANT
MARKER SKIN DUAL TIP RULER LAB (MISCELLANEOUS) IMPLANT
NDL PRECISIONGLIDE 27X1.5 (NEEDLE) IMPLANT
NEEDLE PRECISIONGLIDE 27X1.5 (NEEDLE) IMPLANT
PACK BASIN DAY SURGERY FS (CUSTOM PROCEDURE TRAY) ×3 IMPLANT
PAD DRESSING TELFA 3X8 NADH (GAUZE/BANDAGES/DRESSINGS) IMPLANT
PATTIES SURGICAL .5 X3 (DISPOSABLE) ×2 IMPLANT
SHEET MEDIUM DRAPE 40X70 STRL (DRAPES) ×3 IMPLANT
SOLUTION BUTLER CLEAR DIP (MISCELLANEOUS) ×2 IMPLANT
SPONGE GAUZE 2X2 8PLY STER LF (GAUZE/BANDAGES/DRESSINGS)
SPONGE GAUZE 2X2 8PLY STRL LF (GAUZE/BANDAGES/DRESSINGS) IMPLANT
SYR CONTROL 10ML LL (SYRINGE) IMPLANT
TOWEL GREEN STERILE FF (TOWEL DISPOSABLE) ×3 IMPLANT
TUBE CONNECTING 20'X1/4 (TUBING) ×1
TUBE CONNECTING 20X1/4 (TUBING) ×2 IMPLANT
YANKAUER SUCT BULB TIP NO VENT (SUCTIONS) ×3 IMPLANT

## 2018-07-06 NOTE — Discharge Instructions (Addendum)
Don't blow nose hard. Use afrin if you have any bleeding or trouble breathing. Keflex 500 mg bid for 1 week Tylenol or motrin prn pain Call office for follow up appt. In 1 week    [phone]    Post Anesthesia Home Care Instructions  Activity: Get plenty of rest for the remainder of the day. A responsible individual must stay with you for 24 hours following the procedure.  For the next 24 hours, DO NOT: -Drive a car -Advertising copywriter -Drink alcoholic beverages -Take any medication unless instructed by your physician -Make any legal decisions or sign important papers.  Meals: Start with liquid foods such as gelatin or soup. Progress to regular foods as tolerated. Avoid greasy, spicy, heavy foods. If nausea and/or vomiting occur, drink only clear liquids until the nausea and/or vomiting subsides. Call your physician if vomiting continues.  Special Instructions/Symptoms: Your throat may feel dry or sore from the anesthesia or the breathing tube placed in your throat during surgery. If this causes discomfort, gargle with warm salt water. The discomfort should disappear within 24 hours.  If you had a scopolamine patch placed behind your ear for the management of post- operative nausea and/or vomiting:  1. The medication in the patch is effective for 72 hours, after which it should be removed.  Wrap patch in a tissue and discard in the trash. Wash hands thoroughly with soap and water. 2. You may remove the patch earlier than 72 hours if you experience unpleasant side effects which may include dry mouth, dizziness or visual disturbances. 3. Avoid touching the patch. Wash your hands with soap and water after contact with the patch.

## 2018-07-06 NOTE — H&P (Signed)
PREOPERATIVE H&P  Chief Complaint: recurrent epistaxis  HPI: Madison Leon is a 42 y.o. female who presents for evaluation of recurrent epistaxis that began 2 days ago. Initial bleeding was the worse on Monday. She has not previously had history of nosebleeds. The bleeding initially ran out both sides of her nose and down her throat. She was seen in the office on Monday but the bleeding had stopped and on endoscopy she has some blood in the posterior left nasal cavity but no active bleeding. She had bleeding again on Tuesday but again it stopped spontaneously. She had bleeding again on Thursday night and Wednesday morning which seemed to come from the left side. The nasal cavity is clear anteriorly and I suspect this represents a posterior nosebleed. She is taken to the OR at this time for endoscopic exam and cauterization.  Past Medical History:  Diagnosis Date  . Anxiety   . Arthritis    lower back  . ASCUS of cervix with negative high risk HPV   . Depression   . Heartburn   . Migraine   . Nausea without vomiting   . Situational anxiety    Past Surgical History:  Procedure Laterality Date  . EYE SURGERY     laser eye surgery  . LEEP    . TEMPOROMANDIBULAR JOINT SURGERY Bilateral 2005   injections, was sedated   Social History   Socioeconomic History  . Marital status: Single    Spouse name: Not on file  . Number of children: 1  . Years of education: Not on file  . Highest education level: Associate degree: occupational, Scientist, product/process development, or vocational program  Occupational History  . Not on file  Social Needs  . Financial resource strain: Not on file  . Food insecurity:    Worry: Not on file    Inability: Not on file  . Transportation needs:    Medical: Not on file    Non-medical: Not on file  Tobacco Use  . Smoking status: Current Every Day Smoker    Packs/day: 0.75    Types: Cigarettes  . Smokeless tobacco: Never Used  Substance and Sexual Activity  . Alcohol use:  Yes    Comment: social  . Drug use: Never  . Sexual activity: Not on file  Lifestyle  . Physical activity:    Days per week: Not on file    Minutes per session: Not on file  . Stress: Not on file  Relationships  . Social connections:    Talks on phone: Not on file    Gets together: Not on file    Attends religious service: Not on file    Active member of club or organization: Not on file    Attends meetings of clubs or organizations: Not on file    Relationship status: Not on file  Other Topics Concern  . Not on file  Social History Narrative   Lives at home with her mother and nephew   Right handed   Caffeine: daily, about 3-4 cups    Family History  Problem Relation Age of Onset  . Hepatitis C Mother   . Hypertension Mother   . Fibromyalgia Mother   . Congestive Heart Failure Mother   . Lung cancer Maternal Grandfather   . Migraines Neg Hx    No Known Allergies Prior to Admission medications   Medication Sig Start Date End Date Taking? Authorizing Provider  acetaminophen (TYLENOL) 500 MG tablet Take 1,000 mg by mouth every 6 (  six) hours as needed.   Yes [provider]  Aspirin-Salicylamide-Caffeine (BC HEADACHE POWDER PO) Take by mouth.   Yes [provider]  famotidine (PEPCID) 20 MG tablet Take 20 mg by mouth 2 (two) times daily as needed for heartburn or indigestion.   Yes [provider]  ibuprofen (ADVIL,MOTRIN) 800 MG tablet Take 800 mg by mouth every 8 (eight) hours as needed.   Yes [provider]  levonorgestrel (MIRENA, 52 MG,) 20 MCG/24HR IUD 1 each by Intrauterine route once.   Yes [provider]  methylPREDNISolone (MEDROL DOSEPAK) 4 MG TBPK tablet follow package directions 04/28/18  Yes Anson Fret, MD  ondansetron (ZOFRAN-ODT) 8 MG disintegrating tablet Take 8 mg by mouth every 8 (eight) hours as needed for nausea or vomiting.   Yes [provider]  promethazine (PHENERGAN) 25 MG tablet Take 1  tablet (25 mg total) by mouth every 6 (six) hours as needed for nausea or vomiting. 04/28/18  Yes Anson Fret, MD  promethazine-codeine (PHENERGAN WITH CODEINE) 6.25-10 MG/5ML syrup Take 5 mLs by mouth every 6 (six) hours as needed for cough.   Yes [provider]  ALPRAZolam Prudy Feeler) 0.5 MG tablet Take 0.5 mg by mouth daily as needed for anxiety.    [provider]  HYDROcodone-acetaminophen (NORCO/VICODIN) 5-325 MG per tablet Take 1 tablet by mouth every 6 (six) hours as needed for pain. 01/10/13   Jonita Albee, MD  nortriptyline (PAMELOR) 25 MG capsule Take 1 capsule (25 mg total) by mouth at bedtime. 04/28/18   Anson Fret, MD  tiZANidine (ZANAFLEX) 4 MG tablet Take 1 tablet (4 mg total) by mouth 3 (three) times daily. 04/28/18   Anson Fret, MD     Positive ROS: negative except for recurrent left-sided epistaxis  All other systems have been reviewed and were otherwise negative with the exception of those mentioned in the HPI and as above.  Physical Exam: There were no vitals filed for this visit.  General: Alert, no acute distress Oral: Normal oral mucosa and tonsils Nasal: Clear nasal passages anteriorly.septum deviated to the right. On nasal endoscopy she has blood in the posterior left nasal cavity between the inferior middle turbinate but no abnormal masses no active bleeding. Neck: No palpable adenopathy or thyroid nodules Ear: Ear canal is clear with normal appearing TMs Cardiovascular: Regular rate and rhythm, no murmur.  Respiratory: Clear to auscultation Neurologic: Alert and oriented x 3   Assessment/Plan: EPISTAXIS Plan for Procedure(s): NASAL ENDOSCOPY WITH EPISTAXIS CONTROL   Dillard Cannon, MD 07/06/2018 1:04 PM

## 2018-07-06 NOTE — Anesthesia Preprocedure Evaluation (Signed)
Anesthesia Evaluation  Patient identified by MRN, date of birth, ID band Patient awake    Reviewed: Allergy & Precautions, H&P , NPO status , Patient's Chart, lab work & pertinent test results, reviewed documented beta blocker date and time   Airway Mallampati: II  TM Distance: >3 FB Neck ROM: full    Dental no notable dental hx.    Pulmonary neg pulmonary ROS, Current Smoker,    Pulmonary exam normal breath sounds clear to auscultation       Cardiovascular Exercise Tolerance: Good negative cardio ROS   Rhythm:regular Rate:Normal     Neuro/Psych  Headaches, PSYCHIATRIC DISORDERS Anxiety Depression    GI/Hepatic negative GI ROS, Neg liver ROS,   Endo/Other  negative endocrine ROS  Renal/GU negative Renal ROS  negative genitourinary   Musculoskeletal  (+) Arthritis ,   Abdominal   Peds  Hematology negative hematology ROS (+)   Anesthesia Other Findings   Reproductive/Obstetrics negative OB ROS                            Anesthesia Physical Anesthesia Plan  ASA: II  Anesthesia Plan: General   Post-op Pain Management:    Induction: Intravenous and Cricoid pressure planned  PONV Risk Score and Plan: 3 and Ondansetron, Dexamethasone, Treatment may vary due to age or medical condition and Midazolam  Airway Management Planned: Oral ETT  Additional Equipment:   Intra-op Plan:   Post-operative Plan: Extubation in OR  Informed Consent: I have reviewed the patients History and Physical, chart, labs and discussed the procedure including the risks, benefits and alternatives for the proposed anesthesia with the patient or authorized representative who has indicated his/her understanding and acceptance.   Dental Advisory Given  Plan Discussed with: CRNA, Anesthesiologist and Surgeon  Anesthesia Plan Comments: (  )        Anesthesia Quick Evaluation

## 2018-07-06 NOTE — Interval H&P Note (Signed)
History and Physical Interval Note:  07/06/2018 3:02 PM  Madison Leon  has presented today for surgery, with the diagnosis of EPISTAXIS  The various methods of treatment have been discussed with the patient and family. After consideration of risks, benefits and other options for treatment, the patient has consented to  Procedure(s): NASAL ENDOSCOPY WITH EPISTAXIS CONTROL (N/A) as a surgical intervention .  The patient's history has been reviewed, patient examined, no change in status, stable for surgery.  I have reviewed the patient's chart and labs.  Questions were answered to the patient's satisfaction.     Dillard Cannon

## 2018-07-06 NOTE — Anesthesia Postprocedure Evaluation (Signed)
Anesthesia Post Note  Patient: Madison Leon  Procedure(s) Performed: NASAL ENDOSCOPY WITH EPISTAXIS CONTROL (Bilateral Nose)     Patient location during evaluation: PACU Anesthesia Type: General Level of consciousness: awake and alert Pain management: pain level controlled Vital Signs Assessment: post-procedure vital signs reviewed and stable Respiratory status: spontaneous breathing, nonlabored ventilation, respiratory function stable and patient connected to nasal cannula oxygen Cardiovascular status: blood pressure returned to baseline and stable Postop Assessment: no apparent nausea or vomiting Anesthetic complications: no    Last Vitals:  Vitals:   07/06/18 1630 07/06/18 1645  BP: 116/72 112/68  Pulse: (!) 105 (!) 106  Resp: (!) 23 15  Temp:    SpO2: 98% 100%    Last Pain:  Vitals:   07/06/18 1645  TempSrc:   PainSc: 2                  Aodhan Scheidt

## 2018-07-06 NOTE — Brief Op Note (Signed)
07/06/2018  3:59 PM  PATIENT:  Madison Leon  42 y.o. female  PRE-OPERATIVE DIAGNOSIS:  EPISTAXIS  POST-OPERATIVE DIAGNOSIS:  EPISTAXIS  PROCEDURE:  Procedure(s): NASAL ENDOSCOPY WITH EPISTAXIS CONTROL (Bilateral) with cauterization and packing with nasopore  SURGEON:  Surgeon(s) and Role:    Drema Halon, MD - Primary  PHYSICIAN ASSISTANT:   ASSISTANTS: none   ANESTHESIA:   general  EBL:  15 mL   BLOOD ADMINISTERED:none  DRAINS: none   LOCAL MEDICATIONS USED:  NONE  SPECIMEN:  No Specimen  DISPOSITION OF SPECIMEN:  N/A  COUNTS:  YES  TOURNIQUET:  * No tourniquets in log *  DICTATION: Reubin Milan Dictation Dictated # [id number]  PLAN OF CARE: Discharge to home after PACU  PATIENT DISPOSITION:  PACU - hemodynamically stable.   Delay start of Pharmacological VTE agent (>24hrs) due to surgical blood loss or risk of bleeding: yes

## 2018-07-06 NOTE — Transfer of Care (Signed)
Immediate Anesthesia Transfer of Care Note  Patient: Madison Leon  Procedure(s) Performed: NASAL ENDOSCOPY WITH EPISTAXIS CONTROL (Bilateral Nose)  Patient Location: PACU  Anesthesia Type:General  Level of Consciousness: drowsy and patient cooperative  Airway & Oxygen Therapy: Patient Spontanous Breathing and Patient connected to face mask oxygen  Post-op Assessment: Report given to RN and Post -op Vital signs reviewed and stable  Post vital signs: Reviewed and stable  Last Vitals:  Vitals Value Taken Time  BP 119/66 07/06/2018  4:04 PM  Temp    Pulse 105 07/06/2018  4:07 PM  Resp 19 07/06/2018  4:07 PM  SpO2 100 % 07/06/2018  4:07 PM  Vitals shown include unvalidated device data.  Last Pain:  Vitals:   07/06/18 1312  TempSrc: Oral  PainSc: 3       Patients Stated Pain Goal: 3 (07/06/18 1312)  Complications: No apparent anesthesia complications

## 2018-07-06 NOTE — Anesthesia Procedure Notes (Signed)
Procedure Name: Intubation Date/Time: 07/06/2018 3:12 PM Performed by: Raenette Rover, CRNA Pre-anesthesia Checklist: Patient identified, Emergency Drugs available, Suction available and Patient being monitored Patient Re-evaluated:Patient Re-evaluated prior to induction Oxygen Delivery Method: Circle system utilized Preoxygenation: Pre-oxygenation with 100% oxygen Induction Type: IV induction, Rapid sequence and Cricoid Pressure applied Laryngoscope Size: Mac and 3 Grade View: Grade I Tube type: Oral Tube size: 7.0 mm Number of attempts: 1 Airway Equipment and Method: Stylet Placement Confirmation: ETT inserted through vocal cords under direct vision,  positive ETCO2,  CO2 detector and breath sounds checked- equal and bilateral Secured at: 21 cm Tube secured with: Tape Dental Injury: Teeth and Oropharynx as per pre-operative assessment

## 2018-07-07 ENCOUNTER — Encounter (HOSPITAL_BASED_OUTPATIENT_CLINIC_OR_DEPARTMENT_OTHER): Payer: Self-pay | Admitting: Otolaryngology

## 2018-07-07 NOTE — Op Note (Signed)
NAME: Madison Leon, KLOEHN MEDICAL RECORD ZO:[id number] ACCOUNT [id number] DATE OF BIRTH:04/27/1976 FACILITY: [facility] LOCATION: [facility] PHYSICIAN: Braxton Feathers, MD  OPERATIVE REPORT  DATE OF PROCEDURE:  07/06/2018  PREOPERATIVE DIAGNOSIS:  Recurrent left-sided epistaxis.  POSTOPERATIVE DIAGNOSIS:  Recurrent left-sided epistaxis.  OPERATION PERFORMED:  Nasal endoscopy with cauterization of epistaxis and packing with Nasopore.  SURGEON:  Dillard Cannon, MD  ANESTHESIA:  General endotracheal.  COMPLICATIONS:  None.  ESTIMATED BLOOD LOSS:  Minimal.  BRIEF CLINICAL NOTE:  The patient is a 42 year old female who initially developed a bad nosebleed while at work on Monday.  She was seen in my office and at that time was felt to have a posterior nasal bleed that spontaneously stopped.  Nothing was done  at that time.  She had recurrent bleeding Monday night, was seen back in my office on Monday.  Endoscopy again revealed some posterior blood clots, but no active bleeding noted.  Again, observation was recommended and she continued to have bleeding the  following night, again more from the left side posteriorly and she is taken to the operating room at this time for a nasal endoscopy with examination with cauterization of epistaxis.  DESCRIPTION OF PROCEDURE:  After adequate endotracheal anesthesia,  a nasal endoscopy was performed.  By the time the patient was taken to the operating room.  She had no blood in her nose.  No bleeding over the last 10 hours.  On endoscopy nasal  passageway was actually fairly clear.  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  On the left side, she had a left middle turbinate concha  bullosa and previously the bleeding area seemed to coming from the posterior left middle meatus.  This was examined.  There was nothing abnormal in the left middle  meatus.  The maxillary ostia was open and patent with no drainage.  Cauterization was  performed of the left posterior middle meatus as well as cauterization of the posterior left inferior turbinate.  After cauterization of these areas.  The middle meatus on the left side was packed with a Nasopore soaked in mupirocin 2% ointment.  This  completed the procedure.    The patient was subsequently awoken from anesthesia and transferred to recovery room  postoperatively doing well.  DISPOSITION:  She will be discharged home later this afternoon on Keflex 500 mg b.i.d. for [redacted] week along with ibuprofen p.r.n. pain.  She will follow up in my office in 1 week for recheck.  AN/NUANCE  D:07/06/2018 T:07/07/2018 JOB:[id number]

## 2018-08-15 ENCOUNTER — Encounter (HOSPITAL_COMMUNITY): Payer: Self-pay | Admitting: *Deleted

## 2018-08-15 ENCOUNTER — Other Ambulatory Visit: Payer: Self-pay

## 2018-08-29 ENCOUNTER — Other Ambulatory Visit: Payer: Self-pay | Admitting: Obstetrics and Gynecology

## 2018-09-07 NOTE — H&P (Signed)
Madison Leon is an 42 y.o. female. History of menorrhagia and desires sterilization.  Pertinent Gynecological History: Menses: flow is moderate Bleeding: dysfunctional uterine bleeding Contraception: none DES exposure: denies Blood transfusions: none Sexually transmitted diseases: no past history Previous GYN Procedures: na  Last mammogram: normal Date: 2019 Last pap: normal Date: 2019 OB History: G1, P1   Menstrual History: Menarche age: 9 No LMP recorded (lmp unknown). (Menstrual status: IUD).    Past Medical History:  Diagnosis Date  . Anxiety   . Arthritis    lower back  . ASCUS of cervix with negative high risk HPV   . Depression   . Heartburn   . Hives due to heat exposure   . Migraine   . Nausea without vomiting   . Situational anxiety   . SVD (spontaneous vaginal delivery)    x 1    Past Surgical History:  Procedure Laterality Date  . EYE SURGERY Bilateral    laser eye surgery  . LEEP    . NASAL ENDOSCOPY WITH EPISTAXIS CONTROL Bilateral 07/06/2018   Procedure: NASAL ENDOSCOPY WITH EPISTAXIS CONTROL;  Surgeon: Drema Halon, MD;  Location: [facility];  Service: ENT;  Laterality: Bilateral;  . TEMPOROMANDIBULAR JOINT SURGERY Bilateral 2005   injections, was sedated  . WISDOM TOOTH EXTRACTION      Family History  Problem Relation Age of Onset  . Hepatitis C Mother   . Hypertension Mother   . Fibromyalgia Mother   . Congestive Heart Failure Mother   . Lung cancer Maternal Grandfather   . Migraines Neg Hx     Social History:  reports that she has been smoking cigarettes. She has a 27.00 pack-year smoking history. She has never used smokeless tobacco. She reports current alcohol use of about 7.0 - 14.0 standard drinks of alcohol per week. She reports that she does not use drugs.  Allergies: No Known Allergies  No medications prior to admission.    Review of Systems  Constitutional: Negative.   All other systems reviewed  and are negative.   Height 5\' 4"  (1.626 m), weight 63.5 kg. Physical Exam  Nursing note and vitals reviewed. Constitutional: She is oriented to person, place, and time. She appears well-developed and well-nourished.  HENT:  Head: Normocephalic and atraumatic.  Neck: Normal range of motion. Neck supple.  Cardiovascular: Normal rate and regular rhythm.  Respiratory: Effort normal and breath sounds normal.  GI: Soft. Bowel sounds are normal.  Genitourinary:    Vagina and uterus normal.   Musculoskeletal: Normal range of motion.  Neurological: She is alert and oriented to person, place, and time. She has normal reflexes.  Skin: Skin is warm and dry.  Psychiatric: She has a normal mood and affect.    No results found for this or any previous visit (from the past 24 hour(s)).  No results found.  Assessment/Plan: Menorrhagia Elective TL Diag HS, EAB. LTL Consent done.  Failure risks of TL discussed Surgical risks noted.  Blossom Crume J 09/07/2018, 9:13 PM

## 2018-09-08 ENCOUNTER — Encounter (HOSPITAL_COMMUNITY)
Admission: RE | Disposition: A | Discharge status: Home / Self Care | Payer: Self-pay | Source: Home / Self Care | Attending: Obstetrics and Gynecology

## 2018-09-08 ENCOUNTER — Ambulatory Visit (HOSPITAL_COMMUNITY): Payer: 59 | Admitting: Anesthesiology

## 2018-09-08 ENCOUNTER — Encounter (HOSPITAL_COMMUNITY): Payer: Self-pay

## 2018-09-08 ENCOUNTER — Other Ambulatory Visit: Payer: Self-pay

## 2018-09-08 ENCOUNTER — Ambulatory Visit (HOSPITAL_COMMUNITY)
Admission: RE | Admit: 2018-09-08 | Discharge: 2018-09-08 | Disposition: A | Discharge status: Home / Self Care | Payer: 59 | Attending: Obstetrics and Gynecology | Admitting: Obstetrics and Gynecology

## 2018-09-08 DIAGNOSIS — R12 Heartburn: Secondary | ICD-10-CM | POA: Insufficient documentation

## 2018-09-08 DIAGNOSIS — F17210 Nicotine dependence, cigarettes, uncomplicated: Secondary | ICD-10-CM | POA: Diagnosis not present

## 2018-09-08 DIAGNOSIS — F419 Anxiety disorder, unspecified: Secondary | ICD-10-CM | POA: Diagnosis not present

## 2018-09-08 DIAGNOSIS — Z79899 Other long term (current) drug therapy: Secondary | ICD-10-CM | POA: Insufficient documentation

## 2018-09-08 DIAGNOSIS — N938 Other specified abnormal uterine and vaginal bleeding: Secondary | ICD-10-CM | POA: Diagnosis not present

## 2018-09-08 DIAGNOSIS — N920 Excessive and frequent menstruation with regular cycle: Secondary | ICD-10-CM | POA: Insufficient documentation

## 2018-09-08 DIAGNOSIS — Z302 Encounter for sterilization: Secondary | ICD-10-CM | POA: Diagnosis not present

## 2018-09-08 HISTORY — PX: LAPAROSCOPIC TUBAL LIGATION: SHX1937

## 2018-09-08 HISTORY — PX: DILITATION & CURRETTAGE/HYSTROSCOPY WITH NOVASURE ABLATION: SHX5568

## 2018-09-08 HISTORY — PX: IUD REMOVAL: SHX5392

## 2018-09-08 LAB — BASIC METABOLIC PANEL
Anion gap: 8 (ref 5–15)
BUN: 13 mg/dL (ref 6–20)
CO2: 24 mmol/L (ref 22–32)
Calcium: 8.8 mg/dL — ABNORMAL LOW (ref 8.9–10.3)
Chloride: 105 mmol/L (ref 98–111)
Creatinine, Ser: 0.84 mg/dL (ref 0.44–1.00)
GFR calc Af Amer: >60 mL/min (ref >60)
GFR calc non Af Amer: >60 mL/min (ref >60)
GLUCOSE: 93 mg/dL (ref 70–99)
Potassium: 4.7 mmol/L (ref 3.5–5.1)
Sodium: 137 mmol/L (ref 135–145)

## 2018-09-08 LAB — CBC
HCT: 43.7 % (ref 36.0–46.0)
Hemoglobin: 14.6 g/dL (ref 12.0–15.0)
MCH: 31.7 pg (ref 26.0–34.0)
MCHC: 33.4 g/dL (ref 30.0–36.0)
MCV: 95 fL (ref 80.0–100.0)
Platelets: 333 10*3/uL (ref 150–400)
RBC: 4.6 MIL/uL (ref 3.87–5.11)
RDW: 13 % (ref 11.5–15.5)
WBC: 9.7 10*3/uL (ref 4.0–10.5)
nRBC: 0 % (ref 0.0–0.2)

## 2018-09-08 LAB — HCG, SERUM, QUALITATIVE: Preg, Serum: NEGATIVE

## 2018-09-08 SURGERY — LIGATION, FALLOPIAN TUBE, LAPAROSCOPIC
Anesthesia: General | Site: Vagina

## 2018-09-08 MED ORDER — VASOPRESSIN 20 UNIT/ML IV SOLN
INTRAVENOUS | Status: AC
  Filled 2018-09-08: qty 1

## 2018-09-08 MED ORDER — MIDAZOLAM HCL 2 MG/2ML IJ SOLN
INTRAMUSCULAR | Status: DC | PRN
  Administered 2018-09-08: 2 mg via INTRAVENOUS

## 2018-09-08 MED ORDER — BUPIVACAINE HCL (PF) 0.25 % IJ SOLN
INTRAMUSCULAR | Status: AC
  Filled 2018-09-08: qty 30

## 2018-09-08 MED ORDER — SODIUM CHLORIDE 0.9 % IR SOLN
Status: DC | PRN
  Administered 2018-09-08: 3000 mL

## 2018-09-08 MED ORDER — ROCURONIUM BROMIDE 100 MG/10ML IV SOLN
INTRAVENOUS | Status: DC | PRN
  Administered 2018-09-08: 50 mg via INTRAVENOUS

## 2018-09-08 MED ORDER — PROMETHAZINE HCL 25 MG/ML IJ SOLN: 6.2500 mg | INTRAMUSCULAR | Status: DC | PRN

## 2018-09-08 MED ORDER — DEXAMETHASONE SODIUM PHOSPHATE 10 MG/ML IJ SOLN
INTRAMUSCULAR | Status: DC | PRN
  Administered 2018-09-08: 4 mg via INTRAVENOUS

## 2018-09-08 MED ORDER — FENTANYL CITRATE (PF) 250 MCG/5ML IJ SOLN
INTRAMUSCULAR | Status: AC
  Filled 2018-09-08: qty 5

## 2018-09-08 MED ORDER — FENTANYL CITRATE (PF) 100 MCG/2ML IJ SOLN
INTRAMUSCULAR | Status: DC | PRN
  Administered 2018-09-08: 100 ug via INTRAVENOUS
  Administered 2018-09-08: 50 ug via INTRAVENOUS
  Administered 2018-09-08: 100 ug via INTRAVENOUS

## 2018-09-08 MED ORDER — OXYCODONE HCL 5 MG/5ML PO SOLN: 5.0000 mg | Freq: Once | ORAL | Status: DC | PRN

## 2018-09-08 MED ORDER — SCOPOLAMINE 1 MG/3DAYS TD PT72
MEDICATED_PATCH | TRANSDERMAL | Status: AC
  Administered 2018-09-08: 1.5 mg via TRANSDERMAL
  Filled 2018-09-08: qty 1

## 2018-09-08 MED ORDER — SODIUM CHLORIDE (PF) 0.9 % IJ SOLN
INTRAMUSCULAR | Status: AC
  Filled 2018-09-08: qty 50

## 2018-09-08 MED ORDER — DEXAMETHASONE SODIUM PHOSPHATE 4 MG/ML IJ SOLN
INTRAMUSCULAR | Status: AC
  Filled 2018-09-08: qty 1

## 2018-09-08 MED ORDER — KETOROLAC TROMETHAMINE 30 MG/ML IJ SOLN
INTRAMUSCULAR | Status: DC | PRN
  Administered 2018-09-08: 30 mg via INTRAVENOUS

## 2018-09-08 MED ORDER — ONDANSETRON HCL 4 MG/2ML IJ SOLN
INTRAMUSCULAR | Status: DC | PRN
  Administered 2018-09-08: 4 mg via INTRAVENOUS

## 2018-09-08 MED ORDER — SUGAMMADEX SODIUM 200 MG/2ML IV SOLN
INTRAVENOUS | Status: DC | PRN
  Administered 2018-09-08: 200 mg via INTRAVENOUS

## 2018-09-08 MED ORDER — LIDOCAINE HCL (CARDIAC) PF 100 MG/5ML IV SOSY
PREFILLED_SYRINGE | INTRAVENOUS | Status: DC | PRN
  Administered 2018-09-08: 60 mg via INTRAVENOUS

## 2018-09-08 MED ORDER — FENTANYL CITRATE (PF) 100 MCG/2ML IJ SOLN: 25.0000 ug | INTRAMUSCULAR | Status: DC | PRN

## 2018-09-08 MED ORDER — MIDAZOLAM HCL 2 MG/2ML IJ SOLN
INTRAMUSCULAR | Status: AC
  Filled 2018-09-08: qty 2

## 2018-09-08 MED ORDER — CEFAZOLIN SODIUM-DEXTROSE 2-4 GM/100ML-% IV SOLN
INTRAVENOUS | Status: AC
  Filled 2018-09-08: qty 100

## 2018-09-08 MED ORDER — SODIUM CHLORIDE (PF) 0.9 % IJ SOLN
INTRAMUSCULAR | Status: DC | PRN
  Administered 2018-09-08: 10 mL

## 2018-09-08 MED ORDER — OXYCODONE HCL 5 MG PO TABS: 5.0000 mg | ORAL_TABLET | Freq: Once | ORAL | Status: DC | PRN

## 2018-09-08 MED ORDER — LACTATED RINGERS IV SOLN
INTRAVENOUS | Status: DC
  Administered 2018-09-08: 125 mL/h via INTRAVENOUS

## 2018-09-08 MED ORDER — SODIUM CHLORIDE (PF) 0.9 % IJ SOLN
INTRAMUSCULAR | Status: AC
  Filled 2018-09-08: qty 10

## 2018-09-08 MED ORDER — CEFAZOLIN SODIUM-DEXTROSE 2-4 GM/100ML-% IV SOLN
2.0000 g | INTRAVENOUS | Status: AC
  Administered 2018-09-08: 2 g via INTRAVENOUS

## 2018-09-08 MED ORDER — PROPOFOL 10 MG/ML IV BOLUS
INTRAVENOUS | Status: DC | PRN
  Administered 2018-09-08: 180 mg via INTRAVENOUS

## 2018-09-08 MED ORDER — LIDOCAINE HCL (CARDIAC) PF 100 MG/5ML IV SOSY
PREFILLED_SYRINGE | INTRAVENOUS | Status: AC
  Filled 2018-09-08: qty 5

## 2018-09-08 MED ORDER — BUPIVACAINE HCL (PF) 0.25 % IJ SOLN
INTRAMUSCULAR | Status: DC | PRN
  Administered 2018-09-08: 20 mL
  Administered 2018-09-08: 10 mL

## 2018-09-08 MED ORDER — HYDROCODONE-ACETAMINOPHEN 5-325 MG PO TABS: 1.0000 | ORAL_TABLET | Freq: Four times a day (QID) | ORAL | 0 refills | Status: DC | PRN

## 2018-09-08 MED ORDER — ONDANSETRON HCL 4 MG/2ML IJ SOLN
INTRAMUSCULAR | Status: AC
  Filled 2018-09-08: qty 2

## 2018-09-08 MED ORDER — SCOPOLAMINE 1 MG/3DAYS TD PT72
1.0000 | MEDICATED_PATCH | Freq: Once | TRANSDERMAL | Status: DC
  Administered 2018-09-08: 1.5 mg via TRANSDERMAL

## 2018-09-08 SURGICAL SUPPLY — 30 items
ABLATOR SURESOUND NOVASURE (ABLATOR) ×4 IMPLANT
ADH SKN CLS APL DERMABOND .7 (GAUZE/BANDAGES/DRESSINGS) ×3
CATH ROBINSON RED A/P 16FR (CATHETERS) ×4 IMPLANT
DERMABOND ADVANCED (GAUZE/BANDAGES/DRESSINGS) ×1
DERMABOND ADVANCED .7 DNX12 (GAUZE/BANDAGES/DRESSINGS) ×3 IMPLANT
DRSG OPSITE POSTOP 3X4 (GAUZE/BANDAGES/DRESSINGS) IMPLANT
DURAPREP 26ML APPLICATOR (WOUND CARE) ×4 IMPLANT
GLOVE BIO SURGEON STRL SZ7.5 (GLOVE) ×8 IMPLANT
GLOVE BIOGEL PI IND STRL 7.0 (GLOVE) ×6 IMPLANT
GLOVE BIOGEL PI INDICATOR 7.0 (GLOVE) ×2
GOWN STRL REUS W/TWL LRG LVL3 (GOWN DISPOSABLE) ×8 IMPLANT
HIBICLENS CHG 4% 4OZ BTL (MISCELLANEOUS) ×4 IMPLANT
KIT PROCEDURE FLUENT (KITS) ×4 IMPLANT
NEEDLE INSUFFLATION 120MM (ENDOMECHANICALS) ×4 IMPLANT
PACK LAPAROSCOPY BASIN (CUSTOM PROCEDURE TRAY) ×4 IMPLANT
PACK TRENDGUARD 450 HYBRID PRO (MISCELLANEOUS) IMPLANT
PACK VAGINAL MINOR WOMEN LF (CUSTOM PROCEDURE TRAY) ×4 IMPLANT
PAD OB MATERNITY 4.3X12.25 (PERSONAL CARE ITEMS) ×4 IMPLANT
PAD PREP 24X48 CUFFED NSTRL (MISCELLANEOUS) ×4 IMPLANT
PROTECTOR NERVE ULNAR (MISCELLANEOUS) ×8 IMPLANT
SOLUTION ELECTROLUBE (MISCELLANEOUS) ×4 IMPLANT
SUT VICRYL 0 UR6 27IN ABS (SUTURE) ×4 IMPLANT
SUT VICRYL 4-0 PS2 18IN ABS (SUTURE) IMPLANT
SYR TB 1ML 25GX5/8 (SYRINGE) ×4 IMPLANT
TOWEL OR 17X24 6PK STRL BLUE (TOWEL DISPOSABLE) ×8 IMPLANT
TRENDGUARD 450 HYBRID PRO PACK (MISCELLANEOUS)
TROCAR OPTI TIP 5M 100M (ENDOMECHANICALS) IMPLANT
TROCAR XCEL DIL TIP R 11M (ENDOMECHANICALS) ×4 IMPLANT
TUBING INSUF HEATED (TUBING) ×4 IMPLANT
WARMER LAPAROSCOPE (MISCELLANEOUS) ×4 IMPLANT

## 2018-09-08 NOTE — Discharge Instructions (Signed)
DISCHARGE INSTRUCTIONS: HYSTEROSCOPY / ENDOMETRIAL ABLATION °The following instructions have been prepared to help you care for yourself upon your return home. ° °May Remove Scop patch on or before ° °May take Ibuprofen after ° °May take stool softner while taking narcotic pain medication to prevent constipation.  Drink plenty of water. ° °Personal hygiene: °• Use sanitary pads for vaginal drainage, not tampons. °• Shower the day after your procedure. °• NO tub baths, pools or Jacuzzis for 2-3 weeks. °• Wipe front to back after using the bathroom. ° °Activity and limitations: °• Do NOT drive or operate any equipment for 24 hours. The effects of anesthesia are still present °and drowsiness may result. °• Do NOT rest in bed all day. °• Walking is encouraged. °• Walk up and down stairs slowly. °• You may resume your normal activity in one to two days or as indicated by your physician. °Sexual activity: NO intercourse for at least 2 weeks after the procedure, or as indicated by your °Doctor. ° °Diet: Eat a light meal as desired this evening. You may resume your usual diet tomorrow. ° °Return to Work: You may resume your work activities in one to two days or as indicated by your °Doctor. ° °What to expect after your surgery: Expect to have vaginal bleeding/discharge for 2-3 days and °spotting for up to 10 days. It is not unusual to have soreness for up to 1-2 weeks. You may have a °slight burning sensation when you urinate for the first day. Mild cramps may continue for a couple of °days. You may have a regular period in 2-6 weeks. ° °Call your doctor for any of the following: °• Excessive vaginal bleeding or clotting, saturating and changing one pad every hour. °• Inability to urinate 6 hours after discharge from hospital. °• Pain not relieved by pain medication. °• Fever of 100.4° F or greater. °• Unusual vaginal discharge or odor. ° ° °Post Anesthesia Home Care Instructions ° °Activity: °Get plenty of rest for the  remainder of the day. A responsible individual must stay with you for 24 hours following the procedure.  °For the next 24 hours, DO NOT: °-Drive a car °-Operate machinery °-Drink alcoholic beverages °-Take any medication unless instructed by your physician °-Make any legal decisions or sign important papers. ° °Meals: °Start with liquid foods such as gelatin or soup. Progress to regular foods as tolerated. Avoid greasy, spicy, heavy foods. If nausea and/or vomiting occur, drink only clear liquids until the nausea and/or vomiting subsides. Call your physician if vomiting continues. ° °Special Instructions/Symptoms: °Your throat may feel dry or sore from the anesthesia or the breathing tube placed in your throat during surgery. If this causes discomfort, gargle with warm salt water. The discomfort should disappear within 24 hours. ° °If you had a scopolamine patch placed behind your ear for the management of post- operative nausea and/or vomiting: ° °1. The medication in the patch is effective for 72 hours, after which it should be removed.  Wrap patch in a tissue and discard in the trash. Wash hands thoroughly with soap and water. °2. You may remove the patch earlier than 72 hours if you experience unpleasant side effects which may include dry mouth, dizziness or visual disturbances. °3. Avoid touching the patch. Wash your hands with soap and water after contact with the patch. °  ° °

## 2018-09-08 NOTE — Op Note (Signed)
NAME: Madison Leon, Madison Leon MEDICAL RECORD [id number] ACCOUNT [id number] DATE OF BIRTH:04/30/1976 FACILITY: [facility] LOCATION: [facility] PHYSICIAN:Betzaida Cremeens J. Billy Coast, MD  OPERATIVE REPORT  DATE OF PROCEDURE:  09/08/2018  PREOPERATIVE DIAGNOSIS:  Desire for sterilization, menorrhagia, failed intrauterine device.  POSTOPERATIVE DIAGNOSIS:  Desire for sterilization, menorrhagia, failed intrauterine device.  PROCEDURE:  Diagnostic hysteroscopy, removal of Mirena intrauterine device, endometrial polypectomy, NovaSure endometrial ablation, laparoscopic tubal ligation, lysis of adhesions.  SURGEON:  Olivia Mackie, MD  ASSISTANT:  None.  ANESTHESIA:  General local.  ESTIMATED BLOOD LOSS:  Less than 5 mL.  COMPLICATIONS:  None.  DRAINS:  None.  COUNTS:  Correct.  DISPOSITION:  The patient was taken to the recovery in good condition.  BRIEF OPERATIVE NOTE:  After being apprised of the risks of anesthesia, infection, bleeding, injury to surrounding organs, possible need for repair, delayed versus immediate complications including bowel and bladder, internal vessel injury, possible need  for repair, the patient was brought to the operating room and administered general anesthetic without complications.  Prepped and draped in usual sterile fashion, catheterized until the bladder was empty.  Exam under anesthesia reveals a small  retroflexed uterus and no adnexal masses.  At this time, cervix easily dilated up to a #21 Pratt dilator.  Hysteroscope placed.  Visualization reveals an IUD deviated to the right, not imbedded, not perforated.  Small anterior wall endometrial polyp.  At  this time, IUD was removed using polyp forceps without difficulty.  Endometrial polyp was removed using polyp forceps.  D and C performed using sharp curettage in a 4 quadrant method.  After procedure, the cavity appears normal.  At this time, the  NovaSure device was placed, seated to a length of 6.5 and a width of 4  after a negative CO2 test is initiated for a 43 seconds procedure without difficulty.  Revisualization after the procedure was a normally ablated and well ablated global endometrial  ablation of the endometrial cavity without evidence of perforation.  At this time, Hulka tenaculum was removed.  This portion of the procedure was terminated.  Re-draping and re-gowning was performed prior to initiation of a small infraumbilical incision  with a scalpel.  Veress needle placed.  Opening pressure -4.  Three liters of CO2 insufflated without difficulty.  Trocar placed atraumatically.  Visualization reveals some adhesions of the omentum to the and right lower quadrant and right mid quadrant  areas of the abdomen.  To gain access in the lower quadrant some of these adhesions were lysed bluntly.  At this time, visualization reveals a normal uterus, normal anterior and posterior cul-de-sac, normal left ovary.  Right ovary was partially  visualized and appears normal as well.  The right and left tubes were both traced out to the fimbriated end ampullary isthmic portion of the tube.  Each tube was cauterized using bipolar Kleppinger cautery to a resistance of 0 in 3 contiguous areas.   Both tubes were then divided using hook scissors.  Tubal lumens are visualized.  Good hemostasis was noted.  CO2 was released.  Revisualization reveals no evidence of bleeding and good tubal separation.  At this time, the procedure was terminated.  All  instruments were removed under direct visualization.  CO2 released.  Positive pressure applied.  Incisions closed using 0 Vicryl, 4-0 Vicryl, Dermabond.  The patient tolerated the procedure well, was awakened and transferred to recovery in good  condition.  TN/NUANCE  D:09/08/2018 T:09/08/2018 JOB:[id number]

## 2018-09-08 NOTE — Anesthesia Preprocedure Evaluation (Addendum)
Anesthesia Evaluation  Patient identified by MRN, date of birth, ID band Patient awake    Reviewed: Allergy & Precautions, NPO status , Patient's Chart, lab work & pertinent test results  History of Anesthesia Complications Negative for: history of anesthetic complications  Airway Mallampati: III  TM Distance: >3 FB Neck ROM: Full    Dental  (+) Dental Advisory Given, Teeth Intact   Pulmonary Current Smoker,    breath sounds clear to auscultation       Cardiovascular negative cardio ROS   Rhythm:Regular Rate:Normal     Neuro/Psych  Headaches, PSYCHIATRIC DISORDERS Anxiety Depression    GI/Hepatic negative GI ROS, Neg liver ROS,   Endo/Other  negative endocrine ROS  Renal/GU negative Renal ROS     Musculoskeletal  (+) Arthritis ,   Abdominal   Peds  Hematology negative hematology ROS (+)   Anesthesia Other Findings   Reproductive/Obstetrics                            Anesthesia Physical Anesthesia Plan  ASA: II  Anesthesia Plan: General   Post-op Pain Management:    Induction: Intravenous  PONV Risk Score and Plan: 4 or greater and Treatment may vary due to age or medical condition, Ondansetron, Dexamethasone, Scopolamine patch - Pre-op and Midazolam  Airway Management Planned: Oral ETT  Additional Equipment: None  Intra-op Plan:   Post-operative Plan: Extubation in OR  Informed Consent: I have reviewed the patients History and Physical, chart, labs and discussed the procedure including the risks, benefits and alternatives for the proposed anesthesia with the patient or authorized representative who has indicated his/her understanding and acceptance.   Dental advisory given  Plan Discussed with: CRNA and Anesthesiologist  Anesthesia Plan Comments:        Anesthesia Quick Evaluation

## 2018-09-08 NOTE — Op Note (Signed)
09/08/2018  1:51 PM  PATIENT:  Judye Bos  43 y.o. female  PRE-OPERATIVE DIAGNOSIS:  Desires Sterilization, Menorrhagia  POST-OPERATIVE DIAGNOSIS:  Desires Sterilization, Menorrhagia  PROCEDURE:  Procedure(s): LAPAROSCOPIC TUBAL LIGATION LYSIS OF ADHESIONS DILATATION & CURETTAGE HYSTEROSCOPY WITH NOVASURE ABLATION REMOVAL OF IUD ENDOMETRIAL POLYPECTOMY  SURGEON:  Surgeon(s): Olivia Mackie, MD  ASSISTANTS: none   ANESTHESIA:   local and general  ESTIMATED BLOOD LOSS: 5 mL   DRAINS: none   LOCAL MEDICATIONS USED:  MARCAINE    and Amount: 20 ml  SPECIMEN:  Source of Specimen:  emc  DISPOSITION OF SPECIMEN:  PATHOLOGY  COUNTS:  YES  DICTATION #: [id number]  PLAN OF CARE: dc home  PATIENT DISPOSITION:  PACU - hemodynamically stable.

## 2018-09-08 NOTE — Transfer of Care (Signed)
Immediate Anesthesia Transfer of Care Note  Patient: Madison Leon  Procedure(s) Performed: LAPAROSCOPIC TUBAL LIGATION (Bilateral Abdomen) DILATATION & CURETTAGE/HYSTEROSCOPY WITH NOVASURE ABLATION (N/A Vagina )  Patient Location: PACU  Anesthesia Type:General  Level of Consciousness: awake, alert  and oriented  Airway & Oxygen Therapy: Patient Spontanous Breathing and Patient connected to nasal cannula oxygen  Post-op Assessment: Report given to RN and Post -op Vital signs reviewed and stable  Post vital signs: Reviewed and stable  Last Vitals:  Vitals Value Taken Time  BP    Temp    Pulse    Resp    SpO2      Last Pain:  Vitals:   09/08/18 1159  TempSrc: Oral  PainSc: 0-No pain      Patients Stated Pain Goal: 5 (09/08/18 1159)  Complications: No apparent anesthesia complications

## 2018-09-08 NOTE — Progress Notes (Signed)
Patient seen and examined. Consent witnessed and signed. No changes noted. Update completed. Patient ID: Madison Leon, female   DOB: 05-Oct-1975, 42 y.o.   MRN: [id number] BP 112/80   Pulse 85   Temp 98.6 F (37 C) (Oral)   Resp 16   Ht 5\' 4"  (1.626 m)   Wt 63.5 kg   LMP  (LMP Unknown) Comment: Mirena IUD  SpO2 99%   BMI 24.03 kg/m  . CBC    Component Value Date/Time   WBC 9.7 09/08/2018 1145   RBC 4.60 09/08/2018 1145   HGB 14.6 09/08/2018 1145   HGB 13.9 04/28/2018 0821   HCT 43.7 09/08/2018 1145   HCT 41.7 04/28/2018 0821   PLT 333 09/08/2018 1145   PLT 371 04/28/2018 0821   MCV 95.0 09/08/2018 1145   MCV 95 04/28/2018 0821   MCH 31.7 09/08/2018 1145   MCHC 33.4 09/08/2018 1145   RDW 13.0 09/08/2018 1145   RDW 12.8 04/28/2018 0821   LYMPHSABS 3.2 07/06/2018 1258   MONOABS 0.8 07/06/2018 1258   EOSABS 0.2 07/06/2018 1258   BASOSABS 0.2 (H) 07/06/2018 1258

## 2018-09-08 NOTE — Anesthesia Procedure Notes (Signed)
Procedure Name: Intubation Date/Time: 09/08/2018 1:13 PM Performed by: Jonna Munro, CRNA Pre-anesthesia Checklist: Patient identified, Emergency Drugs available, Suction available, Patient being monitored and Timeout performed Patient Re-evaluated:Patient Re-evaluated prior to induction Oxygen Delivery Method: Circle system utilized Preoxygenation: Pre-oxygenation with 100% oxygen Induction Type: IV induction Laryngoscope Size: Mac and 3 Grade View: Grade I Tube type: Oral Tube size: 7.0 mm Number of attempts: 1 Airway Equipment and Method: Stylet Placement Confirmation: ETT inserted through vocal cords under direct vision,  positive ETCO2,  CO2 detector and breath sounds checked- equal and bilateral Secured at: 22 cm Tube secured with: Tape Dental Injury: Teeth and Oropharynx as per pre-operative assessment

## 2018-09-12 NOTE — Anesthesia Postprocedure Evaluation (Signed)
Anesthesia Post Note  Patient: Madison Leon  Procedure(s) Performed: LAPAROSCOPIC TUBAL LIGATION (Bilateral Abdomen) DILATATION & CURETTAGE/HYSTEROSCOPY WITH NOVASURE ABLATION (N/A Vagina ) INTRAUTERINE DEVICE (IUD) REMOVAL (Vagina )     Patient location during evaluation: PACU Anesthesia Type: General Level of consciousness: awake and alert Pain management: pain level controlled Vital Signs Assessment: post-procedure vital signs reviewed and stable Respiratory status: spontaneous breathing, nonlabored ventilation and respiratory function stable Cardiovascular status: blood pressure returned to baseline and stable Postop Assessment: no apparent nausea or vomiting Anesthetic complications: no    Last Vitals:  Vitals:   09/08/18 1428 09/08/18 1500  BP: 121/70 117/69  Pulse: 95 88  Resp: 13 16  Temp:    SpO2: 98% 100%    Last Pain:  Vitals:   09/08/18 1500  TempSrc:   PainSc: 0-No pain                 Beryle Lathe

## 2018-09-13 ENCOUNTER — Encounter (HOSPITAL_COMMUNITY): Payer: Self-pay | Admitting: Obstetrics and Gynecology

## 2018-10-31 ENCOUNTER — Ambulatory Visit: Payer: 59 | Admitting: Neurology

## 2019-05-15 ENCOUNTER — Other Ambulatory Visit: Payer: Self-pay | Admitting: Neurology

## 2020-10-16 ENCOUNTER — Other Ambulatory Visit: Payer: Self-pay | Admitting: Obstetrics and Gynecology

## 2020-11-01 ENCOUNTER — Encounter (HOSPITAL_BASED_OUTPATIENT_CLINIC_OR_DEPARTMENT_OTHER): Payer: Self-pay | Admitting: Obstetrics and Gynecology

## 2020-11-04 ENCOUNTER — Encounter (HOSPITAL_BASED_OUTPATIENT_CLINIC_OR_DEPARTMENT_OTHER): Payer: Self-pay | Admitting: Obstetrics and Gynecology

## 2020-11-04 ENCOUNTER — Other Ambulatory Visit: Payer: Self-pay

## 2020-11-04 ENCOUNTER — Other Ambulatory Visit (HOSPITAL_COMMUNITY)
Admission: RE | Admit: 2020-11-04 | Discharge: 2020-11-04 | Disposition: A | Discharge status: Home / Self Care | Payer: 59 | Source: Ambulatory Visit | Attending: Obstetrics and Gynecology | Admitting: Obstetrics and Gynecology

## 2020-11-04 DIAGNOSIS — Z20822 Contact with and (suspected) exposure to covid-19: Secondary | ICD-10-CM | POA: Diagnosis not present

## 2020-11-04 DIAGNOSIS — Z01812 Encounter for preprocedural laboratory examination: Secondary | ICD-10-CM | POA: Insufficient documentation

## 2020-11-04 LAB — SARS CORONAVIRUS 2 (TAT 6-24 HRS): SARS Coronavirus 2: NEGATIVE

## 2020-11-04 NOTE — Progress Notes (Signed)
Spoke w/ via phone for pre-op interview--- PT Lab needs dos--- CBC, Serum preg, T&S         Lab results------ no COVID test ------ done 11-04-2020 result in epic Arrive at ------- 1130 on 11-07-2020 NPO after MN NO Solid Food.  Clear liquids from MN until--- 1030 Medications to take morning of surgery ----- NONE Diabetic medication ----- n/a Patient Special Instructions ----- n/a Pre-Op special Istructions ----- n/a Patient verbalized understanding of instructions that were given at this phone interview. Patient denies shortness of breath, chest pain, fever, cough at this phone interview.

## 2020-11-07 ENCOUNTER — Ambulatory Visit (HOSPITAL_BASED_OUTPATIENT_CLINIC_OR_DEPARTMENT_OTHER): Payer: 59 | Admitting: Anesthesiology

## 2020-11-07 ENCOUNTER — Encounter (HOSPITAL_BASED_OUTPATIENT_CLINIC_OR_DEPARTMENT_OTHER)
Admission: RE | Disposition: A | Discharge status: Home / Self Care | Payer: Self-pay | Source: Ambulatory Visit | Attending: Obstetrics and Gynecology

## 2020-11-07 ENCOUNTER — Encounter (HOSPITAL_BASED_OUTPATIENT_CLINIC_OR_DEPARTMENT_OTHER): Payer: Self-pay | Admitting: Obstetrics and Gynecology

## 2020-11-07 ENCOUNTER — Ambulatory Visit (HOSPITAL_BASED_OUTPATIENT_CLINIC_OR_DEPARTMENT_OTHER)
Admission: RE | Admit: 2020-11-07 | Discharge: 2020-11-07 | Disposition: A | Discharge status: Home / Self Care | Payer: 59 | Source: Ambulatory Visit | Attending: Obstetrics and Gynecology | Admitting: Obstetrics and Gynecology

## 2020-11-07 DIAGNOSIS — Z8379 Family history of other diseases of the digestive system: Secondary | ICD-10-CM | POA: Diagnosis not present

## 2020-11-07 DIAGNOSIS — Z801 Family history of malignant neoplasm of trachea, bronchus and lung: Secondary | ICD-10-CM | POA: Diagnosis not present

## 2020-11-07 DIAGNOSIS — F17210 Nicotine dependence, cigarettes, uncomplicated: Secondary | ICD-10-CM | POA: Insufficient documentation

## 2020-11-07 DIAGNOSIS — D071 Carcinoma in situ of vulva: Secondary | ICD-10-CM | POA: Diagnosis present

## 2020-11-07 DIAGNOSIS — Z8269 Family history of other diseases of the musculoskeletal system and connective tissue: Secondary | ICD-10-CM | POA: Insufficient documentation

## 2020-11-07 DIAGNOSIS — Z8249 Family history of ischemic heart disease and other diseases of the circulatory system: Secondary | ICD-10-CM | POA: Diagnosis not present

## 2020-11-07 DIAGNOSIS — J410 Simple chronic bronchitis: Secondary | ICD-10-CM | POA: Insufficient documentation

## 2020-11-07 HISTORY — DX: Simple chronic bronchitis: J41.0

## 2020-11-07 HISTORY — DX: Carcinoma in situ of vulva: D07.1

## 2020-11-07 HISTORY — DX: Other chronic pain: G89.29

## 2020-11-07 HISTORY — DX: Arthralgia of temporomandibular joint, unspecified side: M26.629

## 2020-11-07 HISTORY — PX: VULVECTOMY: SHX1086

## 2020-11-07 HISTORY — DX: Headache, unspecified: R51.9

## 2020-11-07 LAB — CBC
HCT: 43.2 % (ref 36.0–46.0)
Hemoglobin: 14.9 g/dL (ref 12.0–15.0)
MCH: 31.5 pg (ref 26.0–34.0)
MCHC: 34.5 g/dL (ref 30.0–36.0)
MCV: 91.3 fL (ref 80.0–100.0)
Platelets: 342 10*3/uL (ref 150–400)
RBC: 4.73 MIL/uL (ref 3.87–5.11)
RDW: 12.9 % (ref 11.5–15.5)
WBC: 12.3 10*3/uL — ABNORMAL HIGH (ref 4.0–10.5)
nRBC: 0 % (ref 0.0–0.2)

## 2020-11-07 LAB — TYPE AND SCREEN
ABO/RH(D): A POS
Antibody Screen: NEGATIVE

## 2020-11-07 LAB — ABO/RH: ABO/RH(D): A POS

## 2020-11-07 LAB — HCG, SERUM, QUALITATIVE: Preg, Serum: NEGATIVE

## 2020-11-07 SURGERY — WIDE EXCISION VULVECTOMY
Anesthesia: General | Site: Vulva

## 2020-11-07 MED ORDER — DEXAMETHASONE SODIUM PHOSPHATE 4 MG/ML IJ SOLN
INTRAMUSCULAR | Status: DC | PRN
  Administered 2020-11-07: 10 mg via INTRAVENOUS

## 2020-11-07 MED ORDER — OXYCODONE HCL 5 MG PO TABS: 5.0000 mg | ORAL_TABLET | Freq: Once | ORAL | Status: DC | PRN

## 2020-11-07 MED ORDER — PROPOFOL 10 MG/ML IV BOLUS
INTRAVENOUS | Status: AC
  Filled 2020-11-07: qty 20

## 2020-11-07 MED ORDER — CEFAZOLIN SODIUM-DEXTROSE 2-4 GM/100ML-% IV SOLN
INTRAVENOUS | Status: AC
  Filled 2020-11-07: qty 100

## 2020-11-07 MED ORDER — FENTANYL CITRATE (PF) 100 MCG/2ML IJ SOLN
INTRAMUSCULAR | Status: AC
  Filled 2020-11-07: qty 2

## 2020-11-07 MED ORDER — ONDANSETRON HCL 4 MG/2ML IJ SOLN
INTRAMUSCULAR | Status: DC | PRN
  Administered 2020-11-07: 4 mg via INTRAVENOUS

## 2020-11-07 MED ORDER — ONDANSETRON HCL 4 MG/2ML IJ SOLN
INTRAMUSCULAR | Status: AC
  Filled 2020-11-07: qty 2

## 2020-11-07 MED ORDER — MIDAZOLAM HCL 5 MG/5ML IJ SOLN
INTRAMUSCULAR | Status: DC | PRN
  Administered 2020-11-07: 2 mg via INTRAVENOUS

## 2020-11-07 MED ORDER — LIDOCAINE HCL (PF) 2 % IJ SOLN
INTRAMUSCULAR | Status: AC
  Filled 2020-11-07: qty 5

## 2020-11-07 MED ORDER — FENTANYL CITRATE (PF) 100 MCG/2ML IJ SOLN
INTRAMUSCULAR | Status: DC | PRN
  Administered 2020-11-07: 50 ug via INTRAVENOUS

## 2020-11-07 MED ORDER — HYDROCODONE-ACETAMINOPHEN 5-325 MG PO TABS: 1.0000 | ORAL_TABLET | Freq: Four times a day (QID) | ORAL | 0 refills | Status: DC | PRN

## 2020-11-07 MED ORDER — ONDANSETRON HCL 4 MG/2ML IJ SOLN: 4.0000 mg | Freq: Once | INTRAMUSCULAR | Status: DC | PRN

## 2020-11-07 MED ORDER — 0.9 % SODIUM CHLORIDE (POUR BTL) OPTIME
TOPICAL | Status: DC | PRN
  Administered 2020-11-07: 500 mL

## 2020-11-07 MED ORDER — PROPOFOL 10 MG/ML IV BOLUS
INTRAVENOUS | Status: DC | PRN
  Administered 2020-11-07: 150 mg via INTRAVENOUS

## 2020-11-07 MED ORDER — OXYCODONE HCL 5 MG/5ML PO SOLN: 5.0000 mg | Freq: Once | ORAL | Status: DC | PRN

## 2020-11-07 MED ORDER — PHENYLEPHRINE 40 MCG/ML (10ML) SYRINGE FOR IV PUSH (FOR BLOOD PRESSURE SUPPORT)
PREFILLED_SYRINGE | INTRAVENOUS | Status: AC
  Filled 2020-11-07: qty 10

## 2020-11-07 MED ORDER — BUPIVACAINE-EPINEPHRINE 0.25% -1:200000 IJ SOLN
INTRAMUSCULAR | Status: DC | PRN
  Administered 2020-11-07 (×2): 10 mL

## 2020-11-07 MED ORDER — AMISULPRIDE (ANTIEMETIC) 5 MG/2ML IV SOLN: 10.0000 mg | Freq: Once | INTRAVENOUS | Status: DC | PRN

## 2020-11-07 MED ORDER — MIDAZOLAM HCL 2 MG/2ML IJ SOLN
INTRAMUSCULAR | Status: AC
  Filled 2020-11-07: qty 2

## 2020-11-07 MED ORDER — LACTATED RINGERS IV SOLN: INTRAVENOUS | Status: DC

## 2020-11-07 MED ORDER — PHENYLEPHRINE 40 MCG/ML (10ML) SYRINGE FOR IV PUSH (FOR BLOOD PRESSURE SUPPORT)
PREFILLED_SYRINGE | INTRAVENOUS | Status: DC | PRN
  Administered 2020-11-07: 120 ug via INTRAVENOUS
  Administered 2020-11-07: 80 ug via INTRAVENOUS

## 2020-11-07 MED ORDER — FENTANYL CITRATE (PF) 100 MCG/2ML IJ SOLN: 25.0000 ug | INTRAMUSCULAR | Status: DC | PRN

## 2020-11-07 MED ORDER — DEXAMETHASONE SODIUM PHOSPHATE 10 MG/ML IJ SOLN
INTRAMUSCULAR | Status: AC
  Filled 2020-11-07: qty 1

## 2020-11-07 MED ORDER — LIDOCAINE 2% (20 MG/ML) 5 ML SYRINGE
INTRAMUSCULAR | Status: DC | PRN
  Administered 2020-11-07: 60 mg via INTRAVENOUS

## 2020-11-07 MED ORDER — CEFAZOLIN SODIUM-DEXTROSE 2-4 GM/100ML-% IV SOLN
2.0000 g | INTRAVENOUS | Status: AC
  Administered 2020-11-07: 2 g via INTRAVENOUS

## 2020-11-07 SURGICAL SUPPLY — 33 items
BLADE SURG 15 STRL LF DISP TIS (BLADE) ×1 IMPLANT
BLADE SURG 15 STRL SS (BLADE) ×2
CATH ROBINSON RED A/P 16FR (CATHETERS) ×2 IMPLANT
CLOTH BEACON ORANGE TIMEOUT ST (SAFETY) ×2 IMPLANT
COVER BACK TABLE 60X90IN (DRAPES) ×2 IMPLANT
COVER WAND RF STERILE (DRAPES) ×2 IMPLANT
DECANTER SPIKE VIAL GLASS SM (MISCELLANEOUS) IMPLANT
DRAPE SHEET LG 3/4 BI-LAMINATE (DRAPES) ×4 IMPLANT
ELECT NDL TIP 2.8 STRL (NEEDLE) IMPLANT
ELECT NEEDLE TIP 2.8 STRL (NEEDLE) IMPLANT
ELECT REM PT RETURN 9FT ADLT (ELECTROSURGICAL) ×2
ELECTRODE REM PT RTRN 9FT ADLT (ELECTROSURGICAL) ×1 IMPLANT
GLOVE SURG ENC MOIS LTX SZ7.5 (GLOVE) ×4 IMPLANT
GOWN STRL REUS W/TWL LRG LVL3 (GOWN DISPOSABLE) ×10 IMPLANT
KIT TURNOVER CYSTO (KITS) ×2 IMPLANT
LEGGING LITHOTOMY PAIR STRL (DRAPES) ×2 IMPLANT
MANIFOLD NEPTUNE II (INSTRUMENTS) IMPLANT
NEEDLE HYPO 22GX1.5 SAFETY (NEEDLE) ×1 IMPLANT
NS IRRIG 500ML POUR BTL (IV SOLUTION) ×2 IMPLANT
PACK BASIN DAY SURGERY FS (CUSTOM PROCEDURE TRAY) ×2 IMPLANT
PACK PERINEAL COLD (PAD) ×1 IMPLANT
PAD OB MATERNITY 4.3X12.25 (PERSONAL CARE ITEMS) ×2 IMPLANT
PAD PREP 24X48 CUFFED NSTRL (MISCELLANEOUS) ×2 IMPLANT
PENCIL SMOKE EVACUATOR (MISCELLANEOUS) ×2 IMPLANT
SUT VIC AB 0 CT2 27 (SUTURE) ×3 IMPLANT
SUT VIC AB 2-0 SH 27 (SUTURE) ×2
SUT VIC AB 2-0 SH 27X BRD (SUTURE) IMPLANT
SYR CONTROL 10ML LL (SYRINGE) ×1 IMPLANT
TOWEL NATURAL 6PK STERILE (DISPOSABLE) ×4 IMPLANT
TRAY DSU PREP LF (CUSTOM PROCEDURE TRAY) ×2 IMPLANT
TUBE CONNECTING 12X1/4 (SUCTIONS) ×2 IMPLANT
WATER STERILE IRR 500ML POUR (IV SOLUTION) ×2 IMPLANT
YANKAUER SUCT BULB TIP NO VENT (SUCTIONS) IMPLANT

## 2020-11-07 NOTE — Transfer of Care (Signed)
Immediate Anesthesia Transfer of Care Note  Patient: Madison Leon  Procedure(s) Performed: WIDE Local EXCISION of Vulvar Lesion (N/A Vulva)  Patient Location: PACU  Anesthesia Type:General  Level of Consciousness: awake, alert , oriented and patient cooperative  Airway & Oxygen Therapy: Patient Spontanous Breathing  Post-op Assessment: Report given to RN and Post -op Vital signs reviewed and stable  Post vital signs: Reviewed and stable  Last Vitals:  Vitals Value Taken Time  BP    Temp    Pulse 91 11/07/20 1350  Resp 17 11/07/20 1350  SpO2 97 % 11/07/20 1350  Vitals shown include unvalidated device data.  Last Pain:  Vitals:   11/07/20 1137  TempSrc: Oral  PainSc: 0-No pain      Patients Stated Pain Goal: 6 (11/07/20 1137)  Complications: No complications documented.

## 2020-11-07 NOTE — Anesthesia Preprocedure Evaluation (Signed)
Anesthesia Evaluation  Patient identified by MRN, date of birth, ID band Patient awake    Reviewed: Allergy & Precautions, NPO status , Patient's Chart, lab work & pertinent test results  Airway Mallampati: II  TM Distance: >3 FB Neck ROM: Full    Dental no notable dental hx. (+) Teeth Intact   Pulmonary Current Smoker,    Pulmonary exam normal breath sounds clear to auscultation       Cardiovascular negative cardio ROS Normal cardiovascular exam Rhythm:Regular Rate:Normal     Neuro/Psych  Headaches, PSYCHIATRIC DISORDERS Anxiety Depression    GI/Hepatic negative GI ROS, Neg liver ROS,   Endo/Other  negative endocrine ROS  Renal/GU negative Renal ROS   VIN III    Musculoskeletal  (+) Arthritis , TMJ dysfunction   Abdominal   Peds  Hematology negative hematology ROS (+)   Anesthesia Other Findings   Reproductive/Obstetrics                             Anesthesia Physical Anesthesia Plan  ASA: II  Anesthesia Plan: General   Post-op Pain Management:    Induction: Intravenous  PONV Risk Score and Plan: 4 or greater and Treatment may vary due to age or medical condition, Midazolam, Ondansetron and Dexamethasone  Airway Management Planned: LMA  Additional Equipment:   Intra-op Plan:   Post-operative Plan: Extubation in OR  Informed Consent: I have reviewed the patients History and Physical, chart, labs and discussed the procedure including the risks, benefits and alternatives for the proposed anesthesia with the patient or authorized representative who has indicated his/her understanding and acceptance.     Dental advisory given  Plan Discussed with: CRNA and Anesthesiologist  Anesthesia Plan Comments:         Anesthesia Quick Evaluation

## 2020-11-07 NOTE — Op Note (Unsigned)
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  PROCEDURE:  Wide local excision of vulvar lesion.  SURGEON:  Lenoard Aden, MD.  ASSISTANT:  None.  ANESTHESIA:  Local, general.  ESTIMATED BLOOD LOSS:  20 mL  COMPLICATIONS:  None.  DRAINS:  None.  COUNTS:  Correct.  DISPOSITION:  The patient was taken to recovery in good condition.  BRIEF OPERATIVE NOTE:  After being apprised of the risks of anesthesia, infection, bleeding, damage to surrounding organs, possible need for repair, delayed versus immediate complications including bowel and bladder injury, possible need for repair,  inability to cure cancer and possible recurrence of lesion.  DESCRIPTION OF PROCEDURE:  The patient was brought to the operating room and was administered general anesthetic without complications.  She was prepped and draped in usual sterile fashion. Catheterized the bladder was emptied for 75 mL of urine.  Lesion  is well identified at the posterior fourchette area.  The area was infiltrated and marked using a marker pen creating an elliptical excision path with approximately 1 cm on all sides.  At this time, after infiltrating with 0.25% Marcaine with dilute  epinephrine solution an elliptical incision was made encompassing the entire lesion at the level of the posterior fourchette.  The specimen is incidentally detached at the 6 o'clock portion, but otherwise was excised intact, marked by a suture at 12  o'clock.  The bed of the excision is cauterized using electrocautery, creating good hemostasis.  A 0 Vicryl sutures are used to  reapproximate the tissue with interrupted mattress sutures without complication, bringing the deep tissues together.  At this  point, then a 2-0 Vicryl was used in a continuous running fashion to close the defect without complication.  The site was first infiltrated with a dilute Marcaine, epinephrine solution.  The patient tolerated the procedure well.  He was awakened and sent  to recovery in good condition.   PUS D: 11/07/2020 1:47:40 pm T: 11/07/2020 2:43:00 pm  JOB: [id number]/ [id number]

## 2020-11-07 NOTE — Discharge Instructions (Signed)
Post Anesthesia Home Care Instructions  Activity: Get plenty of rest for the remainder of the day. A responsible individual must stay with you for 24 hours following the procedure.  For the next 24 hours, DO NOT: -Drive a car -Advertising copywriter -Drink alcoholic beverages -Take any medication unless instructed by your physician -Make any legal decisions or sign important papers.  Meals: Start with liquid foods such as gelatin or soup. Progress to regular foods as tolerated. Avoid greasy, spicy, heavy foods. If nausea and/or vomiting occur, drink only clear liquids until the nausea and/or vomiting subsides. Call your physician if vomiting continues.  Special Instructions/Symptoms: Your throat may feel dry or sore from the anesthesia or the breathing tube placed in your throat during surgery. If this causes discomfort, gargle with warm salt water. The discomfort should disappear within 24 hours.   Wound Care, Adult Taking care of your wound properly can help to prevent pain, infection, and scarring. It can also help your wound heal more quickly. Follow instructions from your health care provider about how to care for your wound. Supplies needed:  Soap and water.  Wound cleanser.  Gauze.  If needed, a clean bandage (dressing) or other type of wound dressing material to cover or place in the wound. Follow your health care provider's instructions about what dressing supplies to use.  Cream or ointment to apply to the wound, if told by your health care provider. How to care for your wound Cleaning the wound Ask your health care provider how to clean the wound. This may include:  Using mild soap and water or a wound cleanser.  Using a clean gauze to pat the wound dry after cleaning it. Do not rub or scrub the wound. Dressing care  Wash your hands with soap and water for at least 20 seconds before and after you change the dressing. If soap and water are not available, use hand  sanitizer.  Change your dressing as told by your health care provider. This may include: ? Cleaning or rinsing out (irrigating) the wound. ? Placing a dressing over the wound or in the wound (packing). ? Covering the wound with an outer dressing.  Leave any stitches (sutures), skin glue, or adhesive strips in place. These skin closures may need to stay in place for 2 weeks or longer. If adhesive strip edges start to loosen and curl up, you may trim the loose edges. Do not remove adhesive strips completely unless your health care provider tells you to do that.  Ask your health care provider when you can leave the wound uncovered. Checking for infection Check your wound area every day for signs of infection. Check for:  More redness, swelling, or pain.  Fluid or blood.  Warmth.  Pus or a bad smell.   Follow these instructions at home Medicines  If you were prescribed an antibiotic medicine, cream, or ointment, take or apply it as told by your health care provider. Do not stop using the antibiotic even if your condition improves.  If you were prescribed pain medicine, take it 30 minutes before you do any wound care or as told by your health care provider.  Take over-the-counter and prescription medicines only as told by your health care provider. Eating and drinking  Eat a diet that includes protein, vitamin A, vitamin C, and other nutrient-rich foods to help the wound heal. ? Foods rich in protein include meat, fish, eggs, dairy, beans, and nuts. ? Foods rich in vitamin A include  carrots and dark green, leafy vegetables. ? Foods rich in vitamin C include citrus fruits, tomatoes, broccoli, and peppers.  Drink enough fluid to keep your urine pale yellow. General instructions  Do not take baths, swim, use a hot tub, or do anything that would put the wound underwater until your health care provider approves. Ask your health care provider if you may take showers. You may only be  allowed to take sponge baths.  Do not scratch or pick at the wound. Keep it covered as told by your health care provider.  Return to your normal activities as told by your health care provider. Ask your health care provider what activities are safe for you.  Protect your wound from the sun when you are outside for the first 6 months, or for as long as told by your health care provider. Cover up the scar area or apply sunscreen that has an SPF of at least 30.  Do not use any products that contain nicotine or tobacco, such as cigarettes, e-cigarettes, and chewing tobacco. These may delay wound healing. If you need help quitting, ask your health care provider.  Keep all follow-up visits as told by your health care provider. This is important. Contact a health care provider if:  You received a tetanus shot and you have swelling, severe pain, redness, or bleeding at the injection site.  Your pain is not controlled with medicine.  You have any of these signs of infection: ? More redness, swelling, or pain around the wound. ? Fluid or blood coming from the wound. ? Warmth coming from the wound. ? Pus or a bad smell coming from the wound. ? A fever or chills.  You are nauseous or you vomit.  You are dizzy. Get help right away if:  You have a red streak of skin near the area around your wound.  Your wound has been closed with staples, sutures, skin glue, or adhesive strips and it begins to open up and separate.  Your wound is bleeding, and the bleeding does not stop with gentle pressure.  You have a rash.  You faint.  You have trouble breathing. These symptoms may represent a serious problem that is an emergency. Do not wait to see if the symptoms will go away. Get medical help right away. Call your local emergency services (911 in the [place] Do not drive yourself to the hospital. Summary  Always wash your hands with soap and water for at least 20 seconds before and after changing  your dressing.  Change your dressing as told by your health care provider.  To help with healing, eat foods that are rich in protein, vitamin A, vitamin C, and other nutrients.  Check your wound every day for signs of infection. Contact your health care provider if you suspect that your wound is infected. This information is not intended to replace advice given to you by your health care provider. Make sure you discuss any questions you have with your health care provider. Document Revised: 06/09/2019 Document Reviewed: 06/09/2019 Elsevier Patient Education  2021 [employer].

## 2020-11-07 NOTE — Anesthesia Postprocedure Evaluation (Signed)
Anesthesia Post Note  Patient: Madison Leon  Procedure(s) Performed: WIDE Local EXCISION of Vulvar Lesion (N/A Vulva)     Patient location during evaluation: PACU Anesthesia Type: General Level of consciousness: awake and alert and oriented Pain management: pain level controlled Vital Signs Assessment: post-procedure vital signs reviewed and stable Respiratory status: spontaneous breathing, nonlabored ventilation and respiratory function stable Cardiovascular status: blood pressure returned to baseline and stable Postop Assessment: no apparent nausea or vomiting Anesthetic complications: no   No complications documented.  Last Vitals:  Vitals:   11/07/20 1400 11/07/20 1415  BP: 113/80 105/72  Pulse: 87 78  Resp: 18 16  Temp:    SpO2: 98% 97%    Last Pain:  Vitals:   11/07/20 1350  TempSrc:   PainSc: 1                  Ayansh Feutz A.

## 2020-11-07 NOTE — H&P (Signed)
Madison Leon is an 45 y.o. female. VIN3 for wide local excision.  Pertinent Gynecological History: Menses: flow is light Bleeding: na Contraception: none DES exposure: denies Blood transfusions: none Sexually transmitted diseases: no past history Previous GYN Procedures: DNC  Last mammogram: normal Date: 2021 Last pap: normal Date: 2021 OB History: G1, P1   Menstrual History: Menarche age: 50 No LMP recorded. Patient has had an ablation.    Past Medical History:  Diagnosis Date  . Anxiety   . Arthritis    lower back  . Chronic intractable headache    has seen neurologist--- dr Lucia Gaskins  . Depression   . Hives due to heat exposure    at times  . Smokers' cough (HCC)    per pt at times productive  . TMJ syndrome   . VIN III (vulvar intraepithelial neoplasia III)     Past Surgical History:  Procedure Laterality Date  . DILITATION & CURRETTAGE/HYSTROSCOPY WITH NOVASURE ABLATION N/A 09/08/2018   Procedure: DILATATION & CURETTAGE/HYSTEROSCOPY WITH NOVASURE ABLATION;  Surgeon: Olivia Mackie, MD;  Location: [facility];  Service: Gynecology;  Laterality: N/A;  . IUD REMOVAL  09/08/2018   Procedure: INTRAUTERINE DEVICE (IUD) REMOVAL;  Surgeon: Olivia Mackie, MD;  Location: [facility];  Service: Gynecology;;  . LAPAROSCOPIC TUBAL LIGATION Bilateral 09/08/2018   Procedure: LAPAROSCOPIC TUBAL LIGATION;  Surgeon: Olivia Mackie, MD;  Location: [facility];  Service: Gynecology;  Laterality: Bilateral;  . LEEP  yrs ago  . NASAL ENDOSCOPY WITH EPISTAXIS CONTROL Bilateral 07/06/2018   Procedure: NASAL ENDOSCOPY WITH EPISTAXIS CONTROL;  Surgeon: Drema Halon, MD;  Location: [facility];  Service: ENT;  Laterality: Bilateral;  . TEMPOROMANDIBULAR JOINT SURGERY Bilateral 2005   injections, was sedated  . WISDOM TOOTH EXTRACTION      Family History  Problem Relation Age of Onset  . Hepatitis C Mother   . Hypertension Mother   . Fibromyalgia Mother   . Congestive Heart  Failure Mother   . Lung cancer Maternal Grandfather   . Migraines Neg Hx     Social History:  reports that she has been smoking cigarettes. She has a 21.75 pack-year smoking history. She has never used smokeless tobacco. She reports current alcohol use. She reports that she does not use drugs.  Allergies: No Known Allergies  No medications prior to admission.    Review of Systems  Constitutional: Negative.   All other systems reviewed and are negative.   Height 5\' 4"  (1.626 m), weight 59.9 kg. Physical Exam Vitals and nursing note reviewed. Exam conducted with a chaperone present.  Constitutional:      Appearance: Normal appearance.  HENT:     Head: Normocephalic and atraumatic.  Cardiovascular:     Rate and Rhythm: Normal rate and regular rhythm.     Pulses: Normal pulses.     Heart sounds: Normal heart sounds.  Pulmonary:     Effort: Pulmonary effort is normal.     Breath sounds: Normal breath sounds.  Abdominal:     General: Abdomen is flat. Bowel sounds are normal.     Palpations: Abdomen is soft.  Genitourinary:    Comments: Vulvar lesion at post fourchette Musculoskeletal:        General: Normal range of motion.     Cervical back: Normal range of motion and neck supple.  Skin:    General: Skin is warm and dry.  Neurological:     General: No focal deficit present.     Mental  Status: She is alert and oriented to person, place, and time.  Psychiatric:        Mood and Affect: Mood normal.        Behavior: Behavior normal.     No results found for this or any previous visit (from the past 24 hour(s)).  No results found.  Assessment/Plan: VIN 3 by colpo and biopsy Wide local excision of vulvar lesion Surgical consent done. Risks of infection and bleeding with injury to surrounding organs discussed. Possible coexistence with invasive ca noted. Recurrence issues discussed.   TAAVON,RICHARD J 11/07/2020, 6:32 AM

## 2020-11-07 NOTE — Anesthesia Procedure Notes (Signed)
Procedure Name: LMA Insertion Date/Time: 11/07/2020 1:12 PM Performed by: Norva Pavlov, CRNA Pre-anesthesia Checklist: Patient identified, Emergency Drugs available, Suction available and Patient being monitored Patient Re-evaluated:Patient Re-evaluated prior to induction Oxygen Delivery Method: Circle system utilized Preoxygenation: Pre-oxygenation with 100% oxygen Induction Type: IV induction Ventilation: Mask ventilation without difficulty LMA: LMA inserted LMA Size: 4.0 Number of attempts: 1 Airway Equipment and Method: Bite block Placement Confirmation: positive ETCO2 Tube secured with: Tape Dental Injury: Teeth and Oropharynx as per pre-operative assessment

## 2020-11-07 NOTE — Op Note (Signed)
11/07/2020  1:43 PM  PATIENT:  Madison Leon  45 y.o. 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

## 2020-11-08 ENCOUNTER — Encounter (HOSPITAL_BASED_OUTPATIENT_CLINIC_OR_DEPARTMENT_OTHER): Payer: Self-pay | Admitting: Obstetrics and Gynecology

## 2020-11-11 LAB — SURGICAL PATHOLOGY

## 2023-12-27 ENCOUNTER — Ambulatory Visit: Payer: Self-pay | Admitting: General Surgery

## 2023-12-27 NOTE — H&P (Signed)
 REFERRING PHYSICIAN:  Sari Cunning*  PROVIDER:  Denese Finn, MD  MRN: [id number] DOB: 18-Oct-1975 DATE OF ENCOUNTER: 12/27/2023  Subjective   Chief Complaint: No chief complaint on file.     History of Present Illness: Madison Leon is a 48 y.o. female who is seen today as an office consultation at the request of Dr. Charmel Cooter for evaluation of No chief complaint on file. .   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  She recently underwent a colonoscopy and was noted to have 2 areas in anal canal that appeared abnormal.  Biopsy showed AIN grade 1.  She is here today for further evaluation.   Review of Systems: A complete review of systems was obtained from the patient.  I have reviewed this information and discussed as appropriate with the patient.  See HPI as well for other ROS.   Medical History: Past Medical History:  Diagnosis Date   Anxiety     There is no problem list on file for this patient.   Past Surgical History:  Procedure Laterality Date   ESSURE TUBAL LIGATION     FUNCTIONAL ENDOSCOPIC SINUS SURGERY     VULVECTOMY       Not on File  Current Outpatient Medications on File Prior to Visit  Medication Sig Dispense Refill   AJOVY AUTOINJECTOR 225 mg/1.5 mL AtIn Inject 225 mg subcutaneously monthly     ALPRAZolam (XANAX) 0.25 MG tablet Take 0.25 mg by mouth     cholecalciferol (VITAMIN D3) 1,250 mcg (50,000 unit) capsule Take 1 capsule (50,000 IU) once per week for 12 weeks, then start over-the-counter Vit D2 or D3 - 1000 IU daily thereafter     ondansetron  (ZOFRAN -ODT) 4 MG disintegrating tablet Take 4 mg by mouth every 8 (eight) hours as needed     ubrogepant 100 mg Tab Take 100 mg by mouth every 2 (two) hours as needed     aspirin-acetaminophen -caffeine 500-500-65 mg PwPk Take by mouth     No current facility-administered medications on file prior to visit.    Family  History  Problem Relation Age of Onset   Skin cancer Mother    High blood pressure (Hypertension) Sister      Social History   Tobacco Use  Smoking Status Every Day   Current packs/day: 0.50   Types: Cigarettes  Smokeless Tobacco Never     Social History   Socioeconomic History   Marital status: Single  Tobacco Use   Smoking status: Every Day    Current packs/day: 0.50    Types: Cigarettes   Smokeless tobacco: Never  Substance and Sexual Activity   Alcohol use: Yes   Drug use: Defer    Objective:    There were no vitals filed for this visit.   Exam Gen: NAD Abd: soft Rectal: Anterior mass noted.   Labs, Imaging and Diagnostic Testing:  Procedure: Anoscopy Surgeon: Andy Bannister After the risks and benefits were explained, written consent was obtained for above procedure.  A medical assistant chaperone was present thoroughout the entire procedure.  Anesthesia: none Diagnosis: AIN Findings: 2 pedunculated lesions that appear to be condylomatous in nature.  No other lesions noted internally on visual exam.   Assessment and Plan:  Diagnoses and all orders for this visit:  AIN grade I    Patient appears to have 2 condylomatous lesions on a stalk at anterior midline within the anal canal.  I would recommend surgical excision to prevent these from recurring.  She will follow-up with Dr. Harless Lien next month for her VIN.    [name], MD Colon and Rectal Surgery [facility]

## 2024-01-11 NOTE — Progress Notes (Addendum)
 Anesthesia Review:  PCP: none  Cardiologist : none   PPM/ ICD: Device Orders: Rep Notified:  Chest x-ray : EKG : Echo : Stress test: Cardiac Cath :   Activity level: can do a flight of stairs without difficutoy  Sleep Study/ CPAP : none  Fasting Blood Sugar :      / Checks Blood Sugar -- times a day:    Blood Thinner/ Instructions /Last Dose: ASA / Instructions/ Last Dose :    Smoker   PT uses prn Ibuprofen and BC for migraines.  PT instructed at preop to contact DrThomas in regards to preop Ibuprofen and BC instrucgtons.  Pt voiced understandign.

## 2024-01-11 NOTE — Patient Instructions (Signed)
 SURGICAL WAITING ROOM VISITATION  Patients having surgery or a procedure may have no more than 2 support people in the waiting area - these visitors may rotate.    Children under the age of 12 must have an adult with them who is not the patient.  Due to an increase in RSV and influenza rates and associated hospitalizations, children ages 38 and under may not visit patients in [facility] hospitals.  Visitors with respiratory illnesses are discouraged from visiting and should remain at home.  If the patient needs to stay at the hospital during part of their recovery, the visitor guidelines for inpatient rooms apply. Pre-op nurse will coordinate an appropriate time for 1 support person to accompany patient in pre-op.  This support person may not rotate.    Please refer to the [facility] website for the visitor guidelines for Inpatients (after your surgery is over and you are in a regular room).       Your procedure is scheduled on:  01/28/2024    Report to [facility] Main Entrance    Report to admitting at  200pm     Call this number if you have problems the morning of surgery [phone]   Do not eat food :After Midnight.   After Midnight you may have the following liquids until __ 100pm __ DAY OF SURGERY  Water Non-Citrus Juices (without pulp, NO RED-Apple, White grape, White cranberry) Black Coffee (NO MILK/CREAM OR CREAMERS, sugar ok)  Clear Tea (NO MILK/CREAM OR CREAMERS, sugar ok) regular and decaf                             Plain Jell-O (NO RED)                                           Fruit ices (not with fruit pulp, NO RED)                                     Popsicles (NO RED)                                                               Sports drinks like Gatorade (NO RED)              If you have questions, please contact your surgeon's office.       Oral Hygiene is also important to reduce your risk of infection.                                     Remember - BRUSH YOUR TEETH THE MORNING OF SURGERY WITH YOUR REGULAR TOOTHPASTE  DENTURES WILL BE REMOVED PRIOR TO SURGERY PLEASE DO NOT APPLY "Poly grip" OR ADHESIVES!!!   Do NOT smoke after Midnight   Stop all vitamins and herbal supplements 7 days before surgery.   Take these medicines the morning of surgery with A SIP OF WATER: xanax if needed   DO NOT TAKE ANY  ORAL DIABETIC MEDICATIONS DAY OF YOUR SURGERY  Bring CPAP mask and tubing day of surgery.                              You may not have any metal on your body including hair pins, jewelry, and body piercing             Do not wear make-up, lotions, powders, perfumes/cologne, or deodorant  Do not wear nail polish including gel and S&S, artificial/acrylic nails, or any other type of covering on natural nails including finger and toenails. If you have artificial nails, gel coating, etc. that needs to be removed by a nail salon please have this removed prior to surgery or surgery may need to be canceled/ delayed if the surgeon/ anesthesia feels like they are unable to be safely monitored.   Do not shave  48 hours prior to surgery.               Men may shave face and neck.   Do not bring valuables to the hospital. [facility] IS NOT             RESPONSIBLE   FOR VALUABLES.   Contacts, glasses, dentures or bridgework may not be worn into surgery.   Bring small overnight bag day of surgery.   DO NOT BRING YOUR HOME MEDICATIONS TO THE HOSPITAL. PHARMACY WILL DISPENSE MEDICATIONS LISTED ON YOUR MEDICATION LIST TO YOU DURING YOUR ADMISSION IN THE HOSPITAL!    Patients discharged on the day of surgery will not be allowed to drive home.  Someone NEEDS to stay with you for the first 24 hours after anesthesia.   Special Instructions: Bring a copy of your healthcare power of attorney and living will documents the day of surgery if you haven't scanned them before.              Please read over the following fact sheets you were  given: IF YOU HAVE QUESTIONS ABOUT YOUR PRE-OP INSTRUCTIONS PLEASE CALL [phone]   If you received a COVID test during your pre-op visit  it is requested that you wear a mask when out in public, stay away from anyone that may not be feeling well and notify your surgeon if you develop symptoms. If you test positive for Covid or have been in contact with anyone that has tested positive in the last 10 days please notify you surgeon.    [facility] - Preparing for Surgery Before surgery, you can play an important role.  Because skin is not sterile, your skin needs to be as free of germs as possible.  You can reduce the number of germs on your skin by washing with CHG (chlorahexidine gluconate) soap before surgery.  CHG is an antiseptic cleaner which kills germs and bonds with the skin to continue killing germs even after washing. Please DO NOT use if you have an allergy to CHG or antibacterial soaps.  If your skin becomes reddened/irritated stop using the CHG and inform your nurse when you arrive at Short Stay. Do not shave (including legs and underarms) for at least 48 hours prior to the first CHG shower.  You may shave your face/neck. Please follow these instructions carefully:  1.  Shower with CHG Soap the night before surgery and the  morning of Surgery.  2.  If you choose to wash your hair, wash your hair first as usual with your  normal  shampoo.  3.  After you shampoo, rinse your hair and body thoroughly to remove the  shampoo.                           4.  Use CHG as you would any other liquid soap.  You can apply chg directly  to the skin and wash                       Gently with a scrungie or clean washcloth.  5.  Apply the CHG Soap to your body ONLY FROM THE NECK DOWN.   Do not use on face/ open                           Wound or open sores. Avoid contact with eyes, ears mouth and genitals (private parts).                       Wash face,  Genitals (private parts) with your normal soap.              6.  Wash thoroughly, paying special attention to the area where your surgery  will be performed.  7.  Thoroughly rinse your body with warm water from the neck down.  8.  DO NOT shower/wash with your normal soap after using and rinsing off  the CHG Soap.                9.  Pat yourself dry with a clean towel.            10.  Wear clean pajamas.            11.  Place clean sheets on your bed the night of your first shower and do not  sleep with pets. Day of Surgery : Do not apply any lotions/deodorants the morning of surgery.  Please wear clean clothes to the hospital/surgery center.  FAILURE TO FOLLOW THESE INSTRUCTIONS MAY RESULT IN THE CANCELLATION OF YOUR SURGERY PATIENT SIGNATURE_________________________________  NURSE SIGNATURE__________________________________  ________________________________________________________________________

## 2024-01-14 ENCOUNTER — Other Ambulatory Visit: Payer: Self-pay

## 2024-01-14 ENCOUNTER — Encounter (HOSPITAL_COMMUNITY)
Admission: RE | Admit: 2024-01-14 | Discharge: 2024-01-14 | Disposition: A | Discharge status: Home / Self Care | Payer: Self-pay | Source: Ambulatory Visit | Attending: General Surgery

## 2024-01-14 ENCOUNTER — Encounter (HOSPITAL_COMMUNITY): Payer: Self-pay

## 2024-01-14 VITALS — BP 122/83 | HR 83 | Temp 98.7°F | Resp 16 | Ht 64.0 in

## 2024-01-14 DIAGNOSIS — Z01818 Encounter for other preprocedural examination: Secondary | ICD-10-CM | POA: Diagnosis present

## 2024-01-14 DIAGNOSIS — Z01812 Encounter for preprocedural laboratory examination: Secondary | ICD-10-CM | POA: Insufficient documentation

## 2024-01-14 HISTORY — DX: Pneumonia, unspecified organism: J18.9

## 2024-01-14 HISTORY — DX: Gastro-esophageal reflux disease without esophagitis: K21.9

## 2024-01-14 HISTORY — DX: Malignant (primary) neoplasm, unspecified: C80.1

## 2024-01-14 HISTORY — DX: Other specified postprocedural states: Z98.890

## 2024-01-14 LAB — CBC
HCT: 42.5 % (ref 36.0–46.0)
Hemoglobin: 14 g/dL (ref 12.0–15.0)
MCH: 30.2 pg (ref 26.0–34.0)
MCHC: 32.9 g/dL (ref 30.0–36.0)
MCV: 91.6 fL (ref 80.0–100.0)
Platelets: 293 10*3/uL (ref 150–400)
RBC: 4.64 MIL/uL (ref 3.87–5.11)
RDW: 12.8 % (ref 11.5–15.5)
WBC: 8.5 10*3/uL (ref 4.0–10.5)
nRBC: 0 % (ref 0.0–0.2)

## 2024-01-28 ENCOUNTER — Ambulatory Visit (HOSPITAL_COMMUNITY): Admitting: Anesthesiology

## 2024-01-28 ENCOUNTER — Encounter (HOSPITAL_COMMUNITY)
Admission: RE | Disposition: A | Discharge status: Home / Self Care | Payer: Self-pay | Source: Home / Self Care | Attending: General Surgery

## 2024-01-28 ENCOUNTER — Encounter (HOSPITAL_COMMUNITY): Payer: Self-pay | Admitting: General Surgery

## 2024-01-28 ENCOUNTER — Other Ambulatory Visit: Payer: Self-pay

## 2024-01-28 ENCOUNTER — Ambulatory Visit (HOSPITAL_COMMUNITY)
Admission: RE | Admit: 2024-01-28 | Discharge: 2024-01-28 | Disposition: A | Discharge status: Home / Self Care | Attending: General Surgery | Admitting: General Surgery

## 2024-01-28 DIAGNOSIS — A630 Anogenital (venereal) warts: Secondary | ICD-10-CM | POA: Diagnosis not present

## 2024-01-28 DIAGNOSIS — K219 Gastro-esophageal reflux disease without esophagitis: Secondary | ICD-10-CM | POA: Diagnosis not present

## 2024-01-28 DIAGNOSIS — F419 Anxiety disorder, unspecified: Secondary | ICD-10-CM | POA: Diagnosis not present

## 2024-01-28 DIAGNOSIS — K6282 Dysplasia of anus: Secondary | ICD-10-CM | POA: Diagnosis present

## 2024-01-28 DIAGNOSIS — Z01818 Encounter for other preprocedural examination: Secondary | ICD-10-CM

## 2024-01-28 DIAGNOSIS — F17210 Nicotine dependence, cigarettes, uncomplicated: Secondary | ICD-10-CM | POA: Diagnosis not present

## 2024-01-28 HISTORY — PX: ANAL INTRAEPITHELIAL NEOPLASIA EXCISION: SHX5241

## 2024-01-28 LAB — POCT PREGNANCY, URINE: Preg Test, Ur: NEGATIVE

## 2024-01-28 SURGERY — EXCISION, NEOPLASM, INTRAEPITHELIAL, ANUS
Anesthesia: Monitor Anesthesia Care

## 2024-01-28 MED ORDER — 0.9 % SODIUM CHLORIDE (POUR BTL) OPTIME
TOPICAL | Status: DC | PRN
  Administered 2024-01-28: 1000 mL

## 2024-01-28 MED ORDER — PROPOFOL 10 MG/ML IV BOLUS
INTRAVENOUS | Status: AC
  Filled 2024-01-28: qty 20

## 2024-01-28 MED ORDER — TRAMADOL HCL 50 MG PO TABS: 50.0000 mg | ORAL_TABLET | Freq: Four times a day (QID) | ORAL | 0 refills | Status: AC | PRN

## 2024-01-28 MED ORDER — DEXMEDETOMIDINE HCL IN NACL 80 MCG/20ML IV SOLN
INTRAVENOUS | Status: DC | PRN
  Administered 2024-01-28: 4 ug via INTRAVENOUS
  Administered 2024-01-28: 8 ug via INTRAVENOUS

## 2024-01-28 MED ORDER — MIDAZOLAM HCL 2 MG/2ML IJ SOLN
INTRAMUSCULAR | Status: AC
  Filled 2024-01-28: qty 2

## 2024-01-28 MED ORDER — ONDANSETRON HCL 4 MG/2ML IJ SOLN
INTRAMUSCULAR | Status: AC
  Filled 2024-01-28: qty 2

## 2024-01-28 MED ORDER — BUPIVACAINE-EPINEPHRINE 0.25% -1:200000 IJ SOLN
INTRAMUSCULAR | Status: DC | PRN
  Administered 2024-01-28: 50 mL

## 2024-01-28 MED ORDER — BUPIVACAINE-EPINEPHRINE (PF) 0.25% -1:200000 IJ SOLN
INTRAMUSCULAR | Status: AC
  Filled 2024-01-28: qty 30

## 2024-01-28 MED ORDER — CELECOXIB 200 MG PO CAPS
200.0000 mg | ORAL_CAPSULE | ORAL | Status: AC
  Administered 2024-01-28: 200 mg via ORAL
  Filled 2024-01-28: qty 1

## 2024-01-28 MED ORDER — ACETIC ACID 5 % SOLN
Status: DC | PRN
Start: 2024-01-28 — End: 2024-01-28
  Administered 2024-01-28: 1 via TOPICAL

## 2024-01-28 MED ORDER — CHLORHEXIDINE GLUCONATE 0.12 % MT SOLN
15.0000 mL | Freq: Once | OROMUCOSAL | Status: AC
  Administered 2024-01-28: 15 mL via OROMUCOSAL

## 2024-01-28 MED ORDER — FENTANYL CITRATE (PF) 100 MCG/2ML IJ SOLN
INTRAMUSCULAR | Status: DC | PRN
  Administered 2024-01-28 (×2): 25 ug via INTRAVENOUS
  Administered 2024-01-28: 50 ug via INTRAVENOUS

## 2024-01-28 MED ORDER — ONDANSETRON HCL 4 MG/2ML IJ SOLN
INTRAMUSCULAR | Status: DC | PRN
  Administered 2024-01-28: 4 mg via INTRAVENOUS

## 2024-01-28 MED ORDER — MIDAZOLAM HCL 5 MG/5ML IJ SOLN
INTRAMUSCULAR | Status: DC | PRN
  Administered 2024-01-28: 2 mg via INTRAVENOUS

## 2024-01-28 MED ORDER — PROPOFOL 500 MG/50ML IV EMUL
INTRAVENOUS | Status: DC | PRN
  Administered 2024-01-28: 100 ug/kg/min via INTRAVENOUS

## 2024-01-28 MED ORDER — BUPIVACAINE LIPOSOME 1.3 % IJ SUSP
INTRAMUSCULAR | Status: AC
  Filled 2024-01-28: qty 20

## 2024-01-28 MED ORDER — ORAL CARE MOUTH RINSE: 15.0000 mL | Freq: Once | OROMUCOSAL | Status: AC

## 2024-01-28 MED ORDER — PROPOFOL 1000 MG/100ML IV EMUL
INTRAVENOUS | Status: AC
  Filled 2024-01-28: qty 100

## 2024-01-28 MED ORDER — ACETIC ACID 5 % SOLN
Status: AC
  Filled 2024-01-28: qty 12

## 2024-01-28 MED ORDER — PROPOFOL 10 MG/ML IV BOLUS
INTRAVENOUS | Status: DC | PRN
  Administered 2024-01-28 (×4): 50 mg via INTRAVENOUS

## 2024-01-28 MED ORDER — LACTATED RINGERS IV SOLN: INTRAVENOUS | Status: DC

## 2024-01-28 MED ORDER — FENTANYL CITRATE (PF) 100 MCG/2ML IJ SOLN
INTRAMUSCULAR | Status: AC
  Filled 2024-01-28: qty 2

## 2024-01-28 MED ORDER — SODIUM CHLORIDE 0.9% FLUSH: 3.0000 mL | Freq: Two times a day (BID) | INTRAVENOUS | Status: DC

## 2024-01-28 SURGICAL SUPPLY — 33 items
BAG COUNTER SPONGE SURGICOUNT (BAG) IMPLANT
BENZOIN TINCTURE PRP APPL 2/3 (GAUZE/BANDAGES/DRESSINGS) ×1 IMPLANT
BLADE SURG 15 STRL LF DISP TIS (BLADE) IMPLANT
BLADE SURG SZ10 CARB STEEL (BLADE) ×1 IMPLANT
BRIEF MESH DISP LRG (UNDERPADS AND DIAPERS) ×2 IMPLANT
COVER SURGICAL LIGHT HANDLE (MISCELLANEOUS) ×1 IMPLANT
DRAPE UTILITY XL STRL (DRAPES) ×1 IMPLANT
DRSG VASELINE 3X18 (GAUZE/BANDAGES/DRESSINGS) IMPLANT
ELECT REM PT RETURN 15FT ADLT (MISCELLANEOUS) ×1 IMPLANT
GAUZE 4X4 16PLY ~~LOC~~+RFID DBL (SPONGE) IMPLANT
GAUZE PAD ABD 8X10 STRL (GAUZE/BANDAGES/DRESSINGS) IMPLANT
GAUZE SPONGE 4X4 12PLY STRL (GAUZE/BANDAGES/DRESSINGS) IMPLANT
GLOVE BIO SURGEON STRL SZ 6.5 (GLOVE) ×1 IMPLANT
GLOVE INDICATOR 6.5 STRL GRN (GLOVE) ×2 IMPLANT
GOWN STRL REUS W/ TWL XL LVL3 (GOWN DISPOSABLE) ×2 IMPLANT
KIT BASIN OR (CUSTOM PROCEDURE TRAY) ×1 IMPLANT
KIT TURNOVER KIT A (KITS) IMPLANT
LEGGING LITHOTOMY PAIR STRL (DRAPES) IMPLANT
LOOP VESSEL MAXI BLUE (MISCELLANEOUS) IMPLANT
NDL HYPO 25X1 1.5 SAFETY (NEEDLE) ×1 IMPLANT
NDL SAFETY ECLIPSE 18X1.5 (NEEDLE) IMPLANT
NEEDLE HYPO 25X1 1.5 SAFETY (NEEDLE) ×1 IMPLANT
PENCIL SMOKE EVACUATOR (MISCELLANEOUS) IMPLANT
SPIKE FLUID TRANSFER (MISCELLANEOUS) ×1 IMPLANT
SPONGE SURGIFOAM ABS GEL 12-7 (HEMOSTASIS) IMPLANT
SUT CHROMIC 2 0 SH (SUTURE) IMPLANT
SUT ETHIBOND 0 (SUTURE) IMPLANT
SUT ETHILON 2 0 PS N (SUTURE) IMPLANT
SUT GUT CHROMIC 3 0 (SUTURE) IMPLANT
SUT VIC AB 2-0 SH 27X BRD (SUTURE) IMPLANT
SUT VIC AB 3-0 SH 27XBRD (SUTURE) IMPLANT
SYR CONTROL 10ML LL (SYRINGE) ×1 IMPLANT
TOWEL OR 17X26 10 PK STRL BLUE (TOWEL DISPOSABLE) ×1 IMPLANT

## 2024-01-28 NOTE — Op Note (Signed)
 01/28/2024  3:41 PM  PATIENT:  Madison Leon  48 y.o. female  Patient Care Team: Emaline Handsome, MD as PCP - General (Family Medicine)  PRE-OPERATIVE DIAGNOSIS:  AIN GRADE 1  POST-OPERATIVE DIAGNOSIS:  AIN GRADE 1  PROCEDURE:  EXCISION,  ANAL INTRAEPITHELIAL NEOPLASM   Surgeon(s): Joyce Nixon, MD  ASSISTANT: none   ANESTHESIA:   local and MAC  SPECIMEN:  Source of Specimen:  anal canal lesions  DISPOSITION OF SPECIMEN:  PATHOLOGY  COUNTS:  YES  PLAN OF CARE: Discharge to home after PACU  PATIENT DISPOSITION:  PACU - hemodynamically stable.  INDICATION: 48 y.o. F with AIN 1 found on recent colonoscopy   OR FINDINGS: 3 left lateral anal canal lesions  DESCRIPTION: the patient was identified in the preoperative holding area and taken to the OR where they were laid on the operating room table.  MAC anesthesia was induced without difficulty. The patient was then positioned in prone jackknife position with buttocks gently taped apart.  The patient was then prepped and draped in usual sterile fashion.  SCDs were noted to be in place prior to the initiation of anesthesia. A surgical timeout was performed indicating the correct patient, procedure, positioning and need for preoperative antibiotics.  A rectal block was performed using Marcaine  with epinephrine  mixed with Experel.    I began with a digital rectal exam.  The anal canal was gently dilated.  I then placed a Hill-Ferguson anoscope into the anal canal and evaluated this completely.  The lesions were noted on the left side of the anal canal proximal to the dentate line.  I placed an acetic acid soaked sponge in the anal canal and allowed it to soak for ~2 mins.  I then evaluate again and confirmed those were the only concerning areas.  There was no other acetowhite staining.  I excised the lesions sharply and closed the incisions with 3-0 Chromic sutures.  Hemostasis was good.  The remaining local was placed around the  wound for added hemostasis.  A dressing was applied.  The patient was awakened from anesthesia and sent to the PACU in stable condition.  All counts were correct per OR staff.  [name], MD  Colorectal and General [facility]

## 2024-01-28 NOTE — Anesthesia Postprocedure Evaluation (Signed)
 Anesthesia Post Note  Patient: Madison Leon  Procedure(s) Performed: EXCISION, NEOPLASM, INTRAEPITHELIAL, ANUS     Patient location during evaluation: PACU Anesthesia Type: MAC Level of consciousness: awake and alert Pain management: pain level controlled Vital Signs Assessment: post-procedure vital signs reviewed and stable Respiratory status: spontaneous breathing, nonlabored ventilation, respiratory function stable and patient connected to nasal cannula oxygen Cardiovascular status: stable and blood pressure returned to baseline Postop Assessment: no apparent nausea or vomiting Anesthetic complications: no   No notable events documented.  Last Vitals:  Vitals:   01/28/24 1600 01/28/24 1615  BP: 122/89 117/68  Pulse: 76 65  Resp: 14 14  Temp:    SpO2: 99%     Last Pain:  Vitals:   01/28/24 1600  TempSrc:   PainSc: 0-No pain                 Lethaniel Rave

## 2024-01-28 NOTE — Discharge Instructions (Addendum)

## 2024-01-28 NOTE — Transfer of Care (Signed)
 Immediate Anesthesia Transfer of Care Note  Patient: Madison Leon  Procedure(s) Performed: EXCISION, NEOPLASM, INTRAEPITHELIAL, ANUS  Patient Location: PACU  Anesthesia Type:MAC  Level of Consciousness: drowsy  Airway & Oxygen Therapy: Patient Spontanous Breathing and Patient connected to face mask oxygen  Post-op Assessment: Report given to RN and Post -op Vital signs reviewed and stable  Post vital signs: Reviewed and stable  Last Vitals:  Vitals Value Taken Time  BP    Temp    Pulse 79 01/28/24 1550  Resp 15 01/28/24 1550  SpO2 100 % 01/28/24 1550  Vitals shown include unfiled device data.  Last Pain:  Vitals:   01/28/24 1043  TempSrc: Oral         Complications: No notable events documented.

## 2024-01-28 NOTE — Anesthesia Procedure Notes (Signed)
 Procedure Name: MAC Date/Time: 01/28/2024 3:10 PM  Performed by: Elaina Graver, CRNAPre-anesthesia Checklist: Patient identified, Emergency Drugs available, Suction available, Patient being monitored and Timeout performed Patient Re-evaluated:Patient Re-evaluated prior to induction Oxygen Delivery Method: Simple face mask Preoxygenation: Pre-oxygenation with 100% oxygen Induction Type: IV induction Placement Confirmation: positive ETCO2 Dental Injury: Teeth and Oropharynx as per pre-operative assessment

## 2024-01-28 NOTE — Anesthesia Preprocedure Evaluation (Signed)
 Anesthesia Evaluation  Patient identified by MRN, date of birth, ID band Patient awake    Reviewed: Allergy & Precautions, H&P , NPO status , Patient's Chart, lab work & pertinent test results  History of Anesthesia Complications (+) PONV  Airway Mallampati: II  TM Distance: >3 FB Neck ROM: Full    Dental no notable dental hx.    Pulmonary neg pulmonary ROS, Current Smoker   Pulmonary exam normal breath sounds clear to auscultation       Cardiovascular negative cardio ROS Normal cardiovascular exam Rhythm:Regular Rate:Normal     Neuro/Psych  Headaches  Anxiety      negative psych ROS   GI/Hepatic Neg liver ROS,GERD  ,,  Endo/Other  negative endocrine ROS    Renal/GU negative Renal ROS  negative genitourinary   Musculoskeletal  (+) Arthritis ,    Abdominal   Peds negative pediatric ROS (+)  Hematology negative hematology ROS (+)   Anesthesia Other Findings intraepithelial neoplasia   Reproductive/Obstetrics negative OB ROS                             Anesthesia Physical Anesthesia Plan  ASA: 3  Anesthesia Plan: MAC   Post-op Pain Management:    Induction: Intravenous  PONV Risk Score and Plan: 2 and Propofol  infusion and Treatment may vary due to age or medical condition  Airway Management Planned: Natural Airway  Additional Equipment:   Intra-op Plan:   Post-operative Plan:   Informed Consent: I have reviewed the patients History and Physical, chart, labs and discussed the procedure including the risks, benefits and alternatives for the proposed anesthesia with the patient or authorized representative who has indicated his/her understanding and acceptance.     Dental advisory given  Plan Discussed with: CRNA  Anesthesia Plan Comments:        Anesthesia Quick Evaluation

## 2024-01-28 NOTE — H&P (Signed)
 REFERRING PHYSICIAN:  Sari Cunning*   PROVIDER:  Denese Finn, MD   MRN: [id number] DOB: 09-21-1975   Subjective    Chief Complaint: No chief complaint on file.       History of Present Illness: Madison Leon is a 48 y.o. female who is seen today as an office consultation at the request of Dr. Charmel Cooter for evaluation of No chief complaint on file. .   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  She recently underwent a colonoscopy and was noted to have 2 areas in anal canal that appeared abnormal.  Biopsy showed AIN grade 1.  She is here today for further evaluation.     Review of Systems: A complete review of systems was obtained from the patient.  I have reviewed this information and discussed as appropriate with the patient.  See HPI as well for other ROS.     Medical History:     Past Medical History:  Diagnosis Date   Anxiety        There is no problem list on file for this patient.          Past Surgical History:  Procedure Laterality Date   ESSURE TUBAL LIGATION       FUNCTIONAL ENDOSCOPIC SINUS SURGERY       VULVECTOMY          Not on File         Current Outpatient Medications on File Prior to Visit  Medication Sig Dispense Refill   AJOVY AUTOINJECTOR 225 mg/1.5 mL AtIn Inject 225 mg subcutaneously monthly       ALPRAZolam (XANAX) 0.25 MG tablet Take 0.25 mg by mouth       cholecalciferol (VITAMIN D3) 1,250 mcg (50,000 unit) capsule Take 1 capsule (50,000 IU) once per week for 12 weeks, then start over-the-counter Vit D2 or D3 - 1000 IU daily thereafter       ondansetron  (ZOFRAN -ODT) 4 MG disintegrating tablet Take 4 mg by mouth every 8 (eight) hours as needed       ubrogepant 100 mg Tab Take 100 mg by mouth every 2 (two) hours as needed       aspirin-acetaminophen -caffeine 500-500-65 mg PwPk Take by mouth        No current facility-administered medications on file prior  to visit.           Family History  Problem Relation Age of Onset   Skin cancer Mother     High blood pressure (Hypertension) Sister        Social History        Tobacco Use  Smoking Status Every Day   Current packs/day: 0.50   Types: Cigarettes  Smokeless Tobacco Never      Social History         Socioeconomic History   Marital status: Single  Tobacco Use   Smoking status: Every Day      Current packs/day: 0.50      Types: Cigarettes   Smokeless tobacco: Never  Substance and Sexual Activity   Alcohol use: Yes   Drug use: Defer      Objective:      Vitals:   01/28/24 1043  BP: 116/82  Pulse: 84  Resp: 16  Temp: 98.2 F (36.8 C)  SpO2: 97%      Exam Gen: NAD Abd: soft Rectal: Anterior mass noted.  Labs, Imaging and Diagnostic Testing:   Procedure: Anoscopy 12/27/23 Surgeon: Andy Bannister After the risks and benefits were explained, written consent was obtained for above procedure.  A medical assistant chaperone was present thoroughout the entire procedure.  Anesthesia: none Diagnosis: AIN Findings: 2 pedunculated lesions that appear to be condylomatous in nature.  No other lesions noted internally on visual exam.     Assessment and Plan:  Diagnoses and all orders for this visit:   AIN grade I     Patient appears to have 2 condylomatous lesions on a stalk at anterior midline within the anal canal.  I would recommend surgical excision to prevent these from recurring.  She will follow-up with Dr. Harless Lien next month for her VIN.     Madison Howells, MD Colon and Rectal Surgery [facility]

## 2024-01-29 ENCOUNTER — Encounter (HOSPITAL_COMMUNITY): Payer: Self-pay | Admitting: General Surgery

## 2024-02-02 LAB — SURGICAL PATHOLOGY

## 2024-03-20 ENCOUNTER — Encounter: Payer: Self-pay | Admitting: General Surgery
